# Patient Record
Sex: Female | Born: 1975 | Hispanic: No | Marital: Married | Smoking: Former smoker
Health system: Southern US, Community
[De-identification: ages and names within clinical notes are randomized; demographics above are authoritative.]

## PROBLEM LIST (undated history)

## (undated) ENCOUNTER — Inpatient Hospital Stay (HOSPITAL_COMMUNITY): Payer: Self-pay

## (undated) DIAGNOSIS — Z789 Other specified health status: Secondary | ICD-10-CM

## (undated) DIAGNOSIS — D649 Anemia, unspecified: Secondary | ICD-10-CM

## (undated) DIAGNOSIS — H9190 Unspecified hearing loss, unspecified ear: Secondary | ICD-10-CM

## (undated) DIAGNOSIS — R413 Other amnesia: Secondary | ICD-10-CM

## (undated) DIAGNOSIS — F32A Depression, unspecified: Secondary | ICD-10-CM

## (undated) DIAGNOSIS — F329 Major depressive disorder, single episode, unspecified: Secondary | ICD-10-CM

## (undated) HISTORY — DX: Unspecified hearing loss, unspecified ear: H91.90

## (undated) HISTORY — DX: Major depressive disorder, single episode, unspecified: F32.9

## (undated) HISTORY — DX: Anemia, unspecified: D64.9

## (undated) HISTORY — DX: Depression, unspecified: F32.A

## (undated) HISTORY — PX: EYE SURGERY: SHX253

---
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
## 1998-03-16 ENCOUNTER — Inpatient Hospital Stay (HOSPITAL_COMMUNITY): Admission: AD | Admit: 1998-03-16 | Discharge: 1998-03-16 | Payer: Self-pay | Admitting: *Deleted

## 1998-03-23 ENCOUNTER — Inpatient Hospital Stay (HOSPITAL_COMMUNITY): Admission: AD | Admit: 1998-03-23 | Discharge: 1998-03-23 | Payer: Self-pay | Admitting: Obstetrics & Gynecology

## 1998-04-04 ENCOUNTER — Inpatient Hospital Stay (HOSPITAL_COMMUNITY): Admission: AD | Admit: 1998-04-04 | Discharge: 1998-04-08 | Payer: Self-pay | Admitting: Obstetrics & Gynecology

## 2001-09-20 ENCOUNTER — Other Ambulatory Visit: Admission: RE | Admit: 2001-09-20 | Discharge: 2001-09-20 | Payer: Self-pay | Admitting: Obstetrics and Gynecology

## 2001-09-22 ENCOUNTER — Encounter: Admission: RE | Admit: 2001-09-22 | Discharge: 2001-12-21 | Payer: Self-pay | Admitting: Obstetrics and Gynecology

## 2001-09-29 ENCOUNTER — Emergency Department (HOSPITAL_COMMUNITY): Admission: EM | Admit: 2001-09-29 | Discharge: 2001-09-29 | Payer: Self-pay | Admitting: Emergency Medicine

## 2002-02-12 ENCOUNTER — Inpatient Hospital Stay (HOSPITAL_COMMUNITY): Admission: AD | Admit: 2002-02-12 | Discharge: 2002-02-12 | Payer: Self-pay | Admitting: *Deleted

## 2002-02-18 ENCOUNTER — Encounter: Payer: Self-pay | Admitting: Emergency Medicine

## 2002-02-18 ENCOUNTER — Emergency Department (HOSPITAL_COMMUNITY): Admission: AC | Admit: 2002-02-18 | Discharge: 2002-02-18 | Payer: Self-pay

## 2002-02-18 ENCOUNTER — Observation Stay (HOSPITAL_COMMUNITY): Admission: AD | Admit: 2002-02-18 | Discharge: 2002-02-19 | Payer: Self-pay | Admitting: *Deleted

## 2002-02-21 ENCOUNTER — Ambulatory Visit (HOSPITAL_COMMUNITY): Admission: RE | Admit: 2002-02-21 | Discharge: 2002-02-21 | Payer: Self-pay | Admitting: *Deleted

## 2002-03-24 ENCOUNTER — Inpatient Hospital Stay: Admission: AD | Admit: 2002-03-24 | Discharge: 2002-03-24 | Payer: Self-pay | Admitting: *Deleted

## 2002-03-28 ENCOUNTER — Encounter (HOSPITAL_COMMUNITY): Admission: RE | Admit: 2002-03-28 | Discharge: 2002-04-19 | Payer: Self-pay | Admitting: *Deleted

## 2002-04-22 ENCOUNTER — Inpatient Hospital Stay (HOSPITAL_COMMUNITY): Admission: RE | Admit: 2002-04-22 | Discharge: 2002-04-25 | Payer: Self-pay | Admitting: *Deleted

## 2002-05-31 ENCOUNTER — Inpatient Hospital Stay (HOSPITAL_COMMUNITY): Admission: AD | Admit: 2002-05-31 | Discharge: 2002-05-31 | Payer: Self-pay | Admitting: *Deleted

## 2008-01-14 ENCOUNTER — Encounter (INDEPENDENT_AMBULATORY_CARE_PROVIDER_SITE_OTHER): Payer: Self-pay | Admitting: Nurse Practitioner

## 2008-01-14 ENCOUNTER — Ambulatory Visit: Payer: Self-pay | Admitting: Family Medicine

## 2008-01-14 LAB — CONVERTED CEMR LAB
ALT: 12 units/L (ref 0–35)
AST: 16 units/L (ref 0–37)
Albumin: 4.2 g/dL (ref 3.5–5.2)
Alkaline Phosphatase: 64 units/L (ref 39–117)
BUN: 17 mg/dL (ref 6–23)
Basophils Absolute: 0 10*3/uL (ref 0.0–0.1)
Basophils Relative: 0 % (ref 0–1)
CO2: 21 meq/L (ref 19–32)
Calcium: 9.4 mg/dL (ref 8.4–10.5)
Chloride: 106 meq/L (ref 96–112)
Creatinine, Ser: 0.6 mg/dL (ref 0.40–1.20)
Eosinophils Absolute: 0.2 10*3/uL (ref 0.0–0.7)
Eosinophils Relative: 3 % (ref 0–5)
Glucose, Bld: 86 mg/dL (ref 70–99)
HCT: 41.4 % (ref 36.0–46.0)
Hemoglobin: 13.5 g/dL (ref 12.0–15.0)
Lymphocytes Relative: 39 % (ref 12–46)
Lymphs Abs: 2.5 10*3/uL (ref 0.7–4.0)
MCHC: 32.6 g/dL (ref 30.0–36.0)
MCV: 86.4 fL (ref 78.0–100.0)
Monocytes Absolute: 0.4 10*3/uL (ref 0.1–1.0)
Monocytes Relative: 6 % (ref 3–12)
Neutro Abs: 3.4 10*3/uL (ref 1.7–7.7)
Neutrophils Relative %: 53 % (ref 43–77)
Platelets: 338 10*3/uL (ref 150–400)
Potassium: 4.4 meq/L (ref 3.5–5.3)
RBC: 4.79 M/uL (ref 3.87–5.11)
RDW: 12.5 % (ref 11.5–15.5)
Sodium: 140 meq/L (ref 135–145)
TSH: 1.873 microintl units/mL (ref 0.350–5.50)
Total Bilirubin: 0.5 mg/dL (ref 0.3–1.2)
Total Protein: 7 g/dL (ref 6.0–8.3)
WBC: 6.5 10*3/uL (ref 4.0–10.5)

## 2008-02-11 ENCOUNTER — Ambulatory Visit: Payer: Self-pay | Admitting: Family Medicine

## 2008-05-26 ENCOUNTER — Encounter (INDEPENDENT_AMBULATORY_CARE_PROVIDER_SITE_OTHER): Payer: Self-pay | Admitting: Nurse Practitioner

## 2008-05-26 LAB — CONVERTED CEMR LAB
Basophils Absolute: 0 10*3/uL (ref 0.0–0.1)
Basophils Relative: 0 % (ref 0–1)
Eosinophils Absolute: 0.1 10*3/uL (ref 0.0–0.7)
Eosinophils Relative: 2 % (ref 0–5)
HCT: 33.7 % — ABNORMAL LOW (ref 36.0–46.0)
Hemoglobin: 10.7 g/dL — ABNORMAL LOW (ref 12.0–15.0)
Lymphocytes Relative: 42 % (ref 12–46)
Lymphs Abs: 2.5 10*3/uL (ref 0.7–4.0)
MCHC: 31.8 g/dL (ref 30.0–36.0)
MCV: 82.6 fL (ref 78.0–100.0)
Monocytes Absolute: 0.4 10*3/uL (ref 0.1–1.0)
Monocytes Relative: 6 % (ref 3–12)
Neutro Abs: 3 10*3/uL (ref 1.7–7.7)
Neutrophils Relative %: 50 % (ref 43–77)
Platelets: 350 10*3/uL (ref 150–400)
RBC: 4.08 M/uL (ref 3.87–5.11)
RDW: 12.3 % (ref 11.5–15.5)
TSH: 2.967 microintl units/mL (ref 0.350–5.50)
WBC: 6 10*3/uL (ref 4.0–10.5)

## 2008-05-29 ENCOUNTER — Ambulatory Visit: Payer: Self-pay | Admitting: Internal Medicine

## 2008-06-14 ENCOUNTER — Ambulatory Visit: Payer: Self-pay | Admitting: Internal Medicine

## 2008-06-14 ENCOUNTER — Encounter (INDEPENDENT_AMBULATORY_CARE_PROVIDER_SITE_OTHER): Payer: Self-pay | Admitting: Family Medicine

## 2008-06-14 LAB — CONVERTED CEMR LAB
Ferritin: 2 ng/mL — ABNORMAL LOW (ref 10–291)
Folate: >20 ng/mL
Iron: 23 ug/dL — ABNORMAL LOW (ref 42–145)
Saturation Ratios: 5 % — ABNORMAL LOW (ref 20–55)
TIBC: 442 ug/dL (ref 250–470)
UIBC: 419 ug/dL
Vitamin B-12: 496 pg/mL (ref 211–911)

## 2008-06-15 ENCOUNTER — Ambulatory Visit: Payer: Self-pay | Admitting: Internal Medicine

## 2008-06-22 ENCOUNTER — Ambulatory Visit: Payer: Self-pay | Admitting: *Deleted

## 2008-06-27 ENCOUNTER — Encounter (INDEPENDENT_AMBULATORY_CARE_PROVIDER_SITE_OTHER): Payer: Self-pay | Admitting: Family Medicine

## 2008-06-27 ENCOUNTER — Ambulatory Visit: Payer: Self-pay | Admitting: Internal Medicine

## 2008-06-27 LAB — CONVERTED CEMR LAB
Chlamydia, DNA Probe: NEGATIVE
GC Probe Amp, Genital: NEGATIVE

## 2008-08-15 ENCOUNTER — Ambulatory Visit: Payer: Self-pay | Admitting: Internal Medicine

## 2008-10-19 ENCOUNTER — Emergency Department (HOSPITAL_COMMUNITY): Admission: EM | Admit: 2008-10-19 | Discharge: 2008-10-19 | Payer: Self-pay | Admitting: Emergency Medicine

## 2008-10-27 ENCOUNTER — Ambulatory Visit: Payer: Self-pay | Admitting: Internal Medicine

## 2008-11-06 ENCOUNTER — Emergency Department (HOSPITAL_COMMUNITY): Admission: EM | Admit: 2008-11-06 | Discharge: 2008-11-06 | Payer: Self-pay | Admitting: Family Medicine

## 2008-11-06 ENCOUNTER — Emergency Department (HOSPITAL_COMMUNITY): Admission: EM | Admit: 2008-11-06 | Discharge: 2008-11-06 | Payer: Self-pay | Admitting: Emergency Medicine

## 2009-01-25 ENCOUNTER — Ambulatory Visit: Payer: Self-pay | Admitting: Family Medicine

## 2009-01-25 LAB — CONVERTED CEMR LAB
Basophils Absolute: 0 10*3/uL (ref 0.0–0.1)
Basophils Relative: 0 % (ref 0–1)
Eosinophils Absolute: 0.3 10*3/uL (ref 0.0–0.7)
Eosinophils Relative: 4 % (ref 0–5)
HCT: 40.2 % (ref 36.0–46.0)
Hemoglobin: 13.2 g/dL (ref 12.0–15.0)
Lymphocytes Relative: 34 % (ref 12–46)
Lymphs Abs: 2.4 10*3/uL (ref 0.7–4.0)
MCHC: 32.8 g/dL (ref 30.0–36.0)
MCV: 84.8 fL (ref 78.0–100.0)
Monocytes Absolute: 0.4 10*3/uL (ref 0.1–1.0)
Monocytes Relative: 6 % (ref 3–12)
Neutro Abs: 4 10*3/uL (ref 1.7–7.7)
Neutrophils Relative %: 56 % (ref 43–77)
Platelets: 293 10*3/uL (ref 150–400)
RBC: 4.74 M/uL (ref 3.87–5.11)
RDW: 13 % (ref 11.5–15.5)
Vit D, 1,25-Dihydroxy: 29 — ABNORMAL LOW (ref 30–89)
WBC: 7.2 10*3/uL (ref 4.0–10.5)

## 2009-04-18 ENCOUNTER — Ambulatory Visit: Payer: Self-pay | Admitting: Vascular Surgery

## 2009-05-03 ENCOUNTER — Ambulatory Visit: Payer: Self-pay | Admitting: Vascular Surgery

## 2009-09-18 ENCOUNTER — Ambulatory Visit (HOSPITAL_BASED_OUTPATIENT_CLINIC_OR_DEPARTMENT_OTHER): Admission: RE | Admit: 2009-09-18 | Discharge: 2009-09-18 | Payer: Self-pay | Admitting: Internal Medicine

## 2009-09-30 ENCOUNTER — Ambulatory Visit: Payer: Self-pay | Admitting: Internal Medicine

## 2009-10-25 ENCOUNTER — Ambulatory Visit: Payer: Self-pay | Admitting: Psychology

## 2009-12-18 ENCOUNTER — Ambulatory Visit: Payer: Self-pay | Admitting: Psychology

## 2010-11-30 ENCOUNTER — Inpatient Hospital Stay (HOSPITAL_COMMUNITY)
Admission: AD | Admit: 2010-11-30 | Discharge: 2010-11-30 | Payer: Self-pay | Source: Home / Self Care | Attending: Obstetrics and Gynecology | Admitting: Obstetrics and Gynecology

## 2010-12-26 ENCOUNTER — Encounter
Admission: RE | Admit: 2010-12-26 | Discharge: 2011-01-21 | Payer: Self-pay | Source: Home / Self Care | Attending: Obstetrics and Gynecology | Admitting: Obstetrics and Gynecology

## 2010-12-26 ENCOUNTER — Ambulatory Visit (HOSPITAL_COMMUNITY)
Admission: RE | Admit: 2010-12-26 | Discharge: 2010-12-26 | Payer: Self-pay | Source: Home / Self Care | Attending: Obstetrics and Gynecology | Admitting: Obstetrics and Gynecology

## 2011-01-27 ENCOUNTER — Encounter (HOSPITAL_COMMUNITY)
Admission: RE | Admit: 2011-01-27 | Discharge: 2011-01-27 | Disposition: A | Discharge status: Home / Self Care | Payer: Medicaid Other | Source: Ambulatory Visit | Attending: Obstetrics and Gynecology | Admitting: Obstetrics and Gynecology

## 2011-01-27 DIAGNOSIS — Z01812 Encounter for preprocedural laboratory examination: Secondary | ICD-10-CM | POA: Insufficient documentation

## 2011-01-27 LAB — CBC
MCH: 27.6 pg (ref 26.0–34.0)
MCV: 85.3 fL (ref 78.0–100.0)
Platelets: 271 10*3/uL (ref 150–400)
RDW: 13 % (ref 11.5–15.5)

## 2011-02-03 ENCOUNTER — Inpatient Hospital Stay (HOSPITAL_COMMUNITY)
Admission: RE | Admit: 2011-02-03 | Discharge: 2011-02-06 | DRG: 766 | Disposition: A | Discharge status: Home / Self Care | Payer: Medicaid Other | Source: Ambulatory Visit | Attending: Obstetrics and Gynecology | Admitting: Obstetrics and Gynecology

## 2011-02-03 DIAGNOSIS — O34219 Maternal care for unspecified type scar from previous cesarean delivery: Principal | ICD-10-CM | POA: Diagnosis present

## 2011-02-03 LAB — BASIC METABOLIC PANEL
BUN: 8 mg/dL (ref 6–23)
CO2: 21 mEq/L (ref 19–32)
Chloride: 105 mEq/L (ref 96–112)
Creatinine, Ser: 0.48 mg/dL (ref 0.4–1.2)
Glucose, Bld: 93 mg/dL (ref 70–99)

## 2011-02-03 LAB — ABO/RH: ABO/RH(D): O POS

## 2011-02-04 LAB — BASIC METABOLIC PANEL
Chloride: 105 mEq/L (ref 96–112)
GFR calc non Af Amer: >60 mL/min (ref >60)
Glucose, Bld: 113 mg/dL — ABNORMAL HIGH (ref 70–99)
Potassium: 3.9 mEq/L (ref 3.5–5.1)
Sodium: 135 mEq/L (ref 135–145)

## 2011-02-04 LAB — CBC
HCT: 31.6 % — ABNORMAL LOW (ref 36.0–46.0)
Hemoglobin: 10.2 g/dL — ABNORMAL LOW (ref 12.0–15.0)
RBC: 3.68 MIL/uL — ABNORMAL LOW (ref 3.87–5.11)
RDW: 13.1 % (ref 11.5–15.5)
WBC: 8.2 10*3/uL (ref 4.0–10.5)

## 2011-02-05 LAB — GLUCOSE, CAPILLARY
Glucose-Capillary: 91 mg/dL (ref 70–99)
Glucose-Capillary: 87 mg/dL (ref 70–99)
Glucose-Capillary: 144 mg/dL — ABNORMAL HIGH (ref 70–99)

## 2011-02-06 NOTE — Op Note (Signed)
NAME:  Angela Romero, Angela Romero           ACCOUNT NO.:  [id number]  MEDICAL RECORD NO.:  [id number]           PATIENT TYPE:  I  LOCATION:  9129                          FACILITY:  [facility]  PHYSICIAN:  Sherron Monday, MD        DATE OF BIRTH:  August 04, 1976  DATE OF PROCEDURE:  02/03/2011 DATE OF DISCHARGE:                              OPERATIVE REPORT   PREOPERATIVE DIAGNOSES:  Intrauterine pregnancy at 39 weeks, declined vaginal birth after cesarean (section), history of low transverse cesarean section, desires repeat.  POSTOPERATIVE DIAGNOSES:  Intrauterine pregnancy at 39 weeks, declined vaginal birth after cesarean (section), history of low transverse cesarean section, desires repeat, delivered.  PROCEDURE:  Repeat low transverse cesarean section.  SURGEON:  Sherron Monday, MD  ASSISTANT:  Malachi Pro. Ambrose Mantle, MD  ANESTHESIA:  Spinal.  FINDINGS:  Viable female infant at 7:40 a.m. with Apgars of 8 at 1 and 9 at 5 minutes and a weight of 8 pounds 1 ounce, normal uterus, tubes, and ovaries.  SPECIMEN:  Placenta.  COMPLICATIONS:  None.  ESTIMATED BLOOD LOSS:  900 mL  IV FLUIDS:  1600 mL urine output, 75 mL clear urine at the end of the procedure.  PROCEDURE:  After informed consent was reviewed with the patient including risks, benefits, and alternatives of the surgical procedure, including but not limited to bleeding, infection, and damage to the surrounding organs, as well as trouble healing, she was transported to the OR where spinal anesthesia was placed and found to be adequate.  She was then returned to the table in supine position with a leftward tilt and prepped and draped in the normal sterile fashion.  A catheter was sterilely placed.  A Pfannenstiel incision was made at the level of her previous incision, carried down to the underlying layer of fascia sharply.  The fascia was then incised in the midline, and the incision was extended laterally with Mayo scissors.  The inferior  aspect of the fascial incision was grasped with Kocher clamps, elevated, and the rectus muscles were dissected off both bluntly and sharply.  Attention was then turned to the superior portion of the incision which in a similar fashion, grasped with Kocher clamps, elevating, and the rectus muscles were dissected off both bluntly and sharply.  Midline was easily identified.  Peritoneum was entered bluntly.  The incision was extended superiorly and inferiorly with good visualization of the bladder.  The Alexis skin retractor was placed carefully making sure no bowel was entrapped.  The uterus was explored, the vesicoperitoneum was easily identified, tented up with smooth pickups and bladder flap was created both digitally and sharply.  The uterus was incised in a transverse fashion.  Infant was delivered with aid of a vacuum from the vertex presentation.  Nose and mouth were suctioned.  Cord was clamped and cut. Infant was handed off to awaiting pediatric staff.  The placenta was expressed and manually extracted.  The uterus was cleared of all clots and debris.  The uterine incision was closed in layers of 0 Monocryl, the first of which was a running lock and the second as an imbricating. The incision was  noted to be hemostatic.  Copious pelvic irrigation was performed.  The Alexis skin retractor was removed.  The peritoneum was outlined with Kelly's and reapproximated with 2-0 Vicryl.  Subfascial planes in rectus muscles were made hemostatic. The fascia was reapproximated with 0 Vicryl.  The subcuticular adipose layer was made hemostatic with Bovie cautery, irrigated, and the skin with released.  The incision had been slightly indented, dead space was reapproximated with 2-0 plain gut.  Skin was closed with staples.  The patient tolerated the procedure well.  Sponge, lap, and needle counts were correct x2 at the end of the procedure.     Sherron Monday, MD     JB/MEDQ  D:  02/03/2011   T:  02/03/2011  Job:  [id number]  Electronically Signed by Sherron Monday MD on 02/06/2011 05:22:10 PM

## 2011-02-06 NOTE — H&P (Signed)
NAMECYANI, Angela Romero           ACCOUNT NO.:  [id number]  MEDICAL RECORD NO.:  [id number]           PATIENT TYPE:  O  LOCATION:  SDC                           [facility]:  [facility]  PHYSICIAN:  Sherron Monday, MD        DATE OF BIRTH:  22-Jan-1976  DATE OF ADMISSION:  02/03/2010 DATE OF DISCHARGE:  01/27/2011                             HISTORY & PHYSICAL   ADMISSION DIAGNOSES:  Intrauterine pregnancy at term with history of low- transverse cesarean section as well as declined vaginal birth after cesarean section.  PROCEDURE:  Planned repeat low-transverse cesarean section.  HISTORY OF PRESENT ILLNESS:  A 35 year old G3, P2-0-0-2 at 60 plus weeks for repeat low-transverse cesarean section.  She declines VBEC.  She has had good fetal movement.  No loss of fluid.  No vaginal bleeding. Occasional contractions.  Discussed with her at length risks, benefits, and alternatives of repeat section versus VBEC.  She voiced understanding and wanted to proceed.  Her prenatal care was complicated by late prenatal care as well as gestational diabetes treated with 2.5 mg of glyburide.  PAST MEDICAL HISTORY:  Significant for depression and anxiety.  PAST SURGICAL HISTORY:  Significant for C-section and ear surgery.  PAST OB/GYN HISTORY:  She is a G3, P2-0-0-2.  Her first delivery on April 05, 1998, with female infant by C-section for which she has had prolonged labor.  Second pregnancy was in 04/2002 with female infant delivered as a VBEC.  G3 is the current pregnancy.  No history of any abnormal Pap smears or sexually transmitted diseases.  MEDICATIONS:  Include glyburide 2.5 as well as prenatal vitamins.  ALLERGIES:  No known drug allergies.  No latex allergies.  SOCIAL HISTORY:  The patient denies alcohol, tobacco, or drug use.  She is married and works in [employer].  FAMILY HISTORY:  Significant for diabetes, MI, and prostate cancer.  PRENATAL LABORATORIES:  She is O+.  Antibody screen  negative. Hemoglobin 11.9.  Platelets within normal limits.  Rubella immune.  RPR nonreactive.  Hepatitis B surface antigen negative.  HIV negative. Platelets 321,000.  Gonorrhea negative.  Chlamydia negative.  Cystic fibrosis screen negative.  She had positive varicella antibodies. Ultrasound at 19 weeks confirmed an EDC of 02/20.  AFP was within normal limits.  Glucola 235 after receiving 3-hour Glucola.  Group B strep was negative.  PHYSICAL EXAMINATION:  GENERAL:  No apparent distress. VITAL SIGNS:  Afebrile.  Vital signs stable. CHEST:  Clear to auscultation. HEART:  Regular rate and rhythm. LUNGS: Clear to auscultation bilaterally. ABDOMEN:  Soft, nondistended, and nontender. EXTREMITIES: Symmetric and nontender. VAGINAL:  Reveals cervix was 2 cm dilated, 30% effaced, and minus 2 station.  Fetal heart tones were in the 150s at last check in the office.  ASSESSMENT AND PLAN:  A 35 year old G3, P 2-0-0-2 at 39 weeks for repeat low-transverse cesarean section.  After discussion of risks, benefits, and alternatives, she wished to proceed.  This will be performed on the 13th.     Sherron Monday, MD     JB/MEDQ  D:  01/31/2011  T:  01/31/2011  Job:  [id number]  Electronically Signed by Sherron Monday MD on 02/06/2011 05:21:55 PM

## 2011-02-10 ENCOUNTER — Inpatient Hospital Stay (HOSPITAL_COMMUNITY): Admission: AD | Admit: 2011-02-10 | Payer: Self-pay | Admitting: Obstetrics & Gynecology

## 2011-02-11 NOTE — Discharge Summary (Signed)
  [name], Angela Romero           ACCOUNT NO.:  [id number]  MEDICAL RECORD NO.:  [id number]           PATIENT TYPE:  I  LOCATION:  9129                          [facility]  PHYSICIAN:  Sherron Monday, MD        DATE OF BIRTH:  [birth year]  DATE OF ADMISSION:  02/03/2011 DATE OF DISCHARGE:  02/06/2011                              DISCHARGE SUMMARY   ADMITTING DIAGNOSES: 1. Intrauterine pregnancy at term. 2. History of low transverse cesarean section. 3. Declines vaginal birth after cesarean section.  DISCHARGE DIAGNOSIS: 1. Intrauterine pregnancy at term, delivered. 2. History of low transverse cesarean section. 3. Declines vaginal birth after cesarean section.  PROCEDURE:  Repeat low transverse cesarean section.  HISTORY OF PRESENT ILLNESS:  A 35 year old G3, P2-0-0-2 at 15 plus weeks for repeat low transverse cesarean section.  Please see the dictated history for complete history and physical.  She was admitted, underwent a cesarean section without complication on February 03, 2011, delivering a viable female infant at 7:40 a.m., Apgars of 8 at 1 minute and 9 at 5 minutes, and a weight of 8 pounds and 1 ounce.  Normal uterus, tubes and, ovaries are noted.  EBL was approximately 900 mL.  Her postpartum course was relatively uncomplicated.  She remained afebrile.  Vital signs were stable throughout.  Hemoglobin decreased from 12.6 to 10.2. This was well tolerated.  Her postpartum sugars were all less than 200. Her glyburide had been discontinued.  We will follow up with a 2-hour GTT after a postpartum check.  On postoperative day #3, she was ambulating, voiding.  Her pain was well controlled and she had normal lochia.  At this time, her staples were removed and Steri-Strips were applied to incision.  She was given routine discharge instructions and numbers to call with any questions or problems as well as prescriptions for Motrin, Percocet, and prenatal vitamins.  She will  follow up in the office in approximately 2 weeks.  She voices understanding to all of this.  She is O positive, rubella immune.  Hemoglobin 12.6-10.2.  She is both breast and bottle.  We will discuss contraception at her followup.     Sherron Monday, MD     JB/MEDQ  D:  02/06/2011  T:  02/06/2011  Job:  [id number]  Electronically Signed by Sherron Monday MD on 02/11/2011 01:39:30 PM

## 2011-02-12 ENCOUNTER — Ambulatory Visit (HOSPITAL_COMMUNITY)
Admission: RE | Admit: 2011-02-12 | Discharge: 2011-02-12 | Disposition: A | Discharge status: Home / Self Care | Payer: Medicaid Other | Source: Ambulatory Visit | Attending: Obstetrics and Gynecology | Admitting: Obstetrics and Gynecology

## 2011-02-12 DIAGNOSIS — O9279 Other disorders of lactation: Secondary | ICD-10-CM | POA: Insufficient documentation

## 2011-03-03 LAB — URINALYSIS, ROUTINE W REFLEX MICROSCOPIC
Bilirubin Urine: NEGATIVE
Hgb urine dipstick: NEGATIVE
Specific Gravity, Urine: 1.02 (ref 1.005–1.030)
Urobilinogen, UA: 0.2 mg/dL (ref 0.0–1.0)
pH: 7.5 (ref 5.0–8.0)

## 2011-05-06 NOTE — Consult Note (Signed)
NEW PATIENT CONSULTATION   Zolman, Seona  DOB:  08/25/1976                                       04/18/2009  JXBJY#:[id number]   The patient presents today for evaluation of area of discomfort in her  right medial distal thigh.  She reports that she is having pain with  prolonged standing.  She works in [employer] for 8 hours shifts and  reports significant pain over this area.  She does not have a history of  deep venous thrombosis and does not have any history of swelling or  bleeding or venous ulcers.  She has worn compression garments and these  do give her some relief with prolonged standing.   PAST MEDICAL HISTORY:  Significant for hearing loss.   SOCIAL HISTORY:  She does smoke 1 pack cigarettes per day.  Does not  drink alcohol.   PHYSICAL EXAMINATION:  A well-developed, well-nourished female appearing  her stated age of 42.  Blood pressure is 112/71, pulse 97, respirations  18.  Her dorsalis pedis pulses are 2+ bilaterally.  She does not have  any evidence of changes of venous hypertension or varicose veins.  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   She underwent a formal duplex in our office and this shows no evidence  of reflux in her great or small saphenous vein bilaterally.  On duplex  imaging of this specific area she does have reticular veins under the  skin.  They do not appear to be arising from the saphenous vein.   I discussed this at length with the patient.  I have explained that this  does not lead her to any more serious complications of venous  hypertension.  I did explain the option of sclerotherapy for potential  improvement in her discomfort.  She will consider this and notify us  should she wish to proceed with sclerotherapy.  I feel that this would  be appropriate since she has failed conservative treatment with  compression reporting that this makes the  sensation somewhat better but  she continues to have pain with prolonged standing interfering with her  work.  She will notify us should she wish to proceed.   Larina Earthly, M.D.  Electronically Signed   TFE/MEDQ  D:  04/18/2009  T:  04/19/2009  Job:  [id number]   cc:   Fleet Contras, M.D.

## 2011-05-06 NOTE — Procedures (Signed)
LOWER EXTREMITY VENOUS REFLUX EXAM   INDICATION:  Bilateral spider vein pains.   EXAM:  Using color-flow imaging and pulse Doppler spectral analysis, the  right and left common femoral, superficial femoral, popliteal, posterior  tibial, greater and lesser saphenous veins are evaluated.  There is no  evidence suggesting deep venous insufficiency in the right and left  lower extremity.   The right and left saphenofemoral junctions are competent.  The right  and left GSV's are competent with the caliber as described below.   The right and left proximal short saphenous veins demonstrate  competency.   GSV Diameter (used if found to be incompetent only)                                            Right    Left  Proximal Greater Saphenous Vein           cm       cm  Proximal-to-mid-thigh                     cm       cm  Mid thigh                                 cm       cm  Mid-distal thigh                          cm       cm  Distal thigh                              cm       cm  Knee                                      cm       cm   IMPRESSION:  1. Right and left greater saphenous veins demonstrate competency.  2. The right and left greater saphenous veins are not aneurysmal.  3. The right and left greater saphenous veins are not tortuous.  4. The deep venous system is competent.  5. The right and left lesser saphenous veins are competent.   ___________________________________________  Larina Earthly, M.D.   AC/MEDQ  D:  04/18/2009  T:  04/18/2009  Job:  [id number]

## 2011-05-07 ENCOUNTER — Emergency Department (HOSPITAL_COMMUNITY)
Admission: EM | Admit: 2011-05-07 | Discharge: 2011-05-07 | Disposition: A | Discharge status: Home / Self Care | Payer: Medicaid Other | Attending: Emergency Medicine | Admitting: Emergency Medicine

## 2011-05-07 ENCOUNTER — Emergency Department (HOSPITAL_COMMUNITY): Payer: Medicaid Other

## 2011-05-07 DIAGNOSIS — Y92410 Unspecified street and highway as the place of occurrence of the external cause: Secondary | ICD-10-CM | POA: Insufficient documentation

## 2011-05-07 DIAGNOSIS — M545 Low back pain, unspecified: Secondary | ICD-10-CM | POA: Insufficient documentation

## 2011-05-07 DIAGNOSIS — M542 Cervicalgia: Secondary | ICD-10-CM | POA: Insufficient documentation

## 2011-05-07 DIAGNOSIS — T1490XA Injury, unspecified, initial encounter: Secondary | ICD-10-CM | POA: Insufficient documentation
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
## 2012-07-02 ENCOUNTER — Emergency Department (HOSPITAL_COMMUNITY): Admission: EM | Admit: 2012-07-02 | Discharge: 2012-07-02 | Payer: Medicaid Other | Source: Home / Self Care

## 2012-09-29 ENCOUNTER — Other Ambulatory Visit: Payer: Self-pay | Admitting: Diagnostic Neuroimaging

## 2012-09-29 DIAGNOSIS — R413 Other amnesia: Secondary | ICD-10-CM

## 2012-09-29 DIAGNOSIS — H9190 Unspecified hearing loss, unspecified ear: Secondary | ICD-10-CM

## 2012-09-29 DIAGNOSIS — F329 Major depressive disorder, single episode, unspecified: Secondary | ICD-10-CM

## 2012-10-05 ENCOUNTER — Ambulatory Visit
Admission: RE | Admit: 2012-10-05 | Discharge: 2012-10-05 | Disposition: A | Discharge status: Home / Self Care | Payer: Medicaid Other | Source: Ambulatory Visit | Attending: Diagnostic Neuroimaging | Admitting: Diagnostic Neuroimaging

## 2012-10-05 DIAGNOSIS — F329 Major depressive disorder, single episode, unspecified: Secondary | ICD-10-CM

## 2012-10-05 DIAGNOSIS — R413 Other amnesia: Secondary | ICD-10-CM

## 2012-10-05 DIAGNOSIS — H9190 Unspecified hearing loss, unspecified ear: Secondary | ICD-10-CM

## 2013-04-15 ENCOUNTER — Encounter (HOSPITAL_COMMUNITY): Payer: Self-pay | Admitting: *Deleted

## 2013-04-15 ENCOUNTER — Emergency Department (INDEPENDENT_AMBULATORY_CARE_PROVIDER_SITE_OTHER): Payer: Medicaid Other

## 2013-04-15 ENCOUNTER — Emergency Department (HOSPITAL_COMMUNITY)
Admission: EM | Admit: 2013-04-15 | Discharge: 2013-04-15 | Disposition: A | Discharge status: Home / Self Care | Payer: Medicaid Other | Source: Home / Self Care

## 2013-04-15 DIAGNOSIS — J020 Streptococcal pharyngitis: Secondary | ICD-10-CM

## 2013-04-15 DIAGNOSIS — J45909 Unspecified asthma, uncomplicated: Secondary | ICD-10-CM

## 2013-04-15 DIAGNOSIS — J309 Allergic rhinitis, unspecified: Secondary | ICD-10-CM

## 2013-04-15 LAB — POCT URINALYSIS DIP (DEVICE)
Bilirubin Urine: NEGATIVE
Ketones, ur: NEGATIVE mg/dL
pH: 5.5 (ref 5.0–8.0)

## 2013-04-15 MED ORDER — BROMPHENIRAMINE-PHENYLEPHRINE 1-2.5 MG/5ML PO ELIX: 5.0000 mL | ORAL_SOLUTION | ORAL | Status: DC | PRN

## 2013-04-15 MED ORDER — AMOXICILLIN 500 MG PO CAPS: 500.0000 mg | ORAL_CAPSULE | Freq: Three times a day (TID) | ORAL | Status: DC

## 2013-04-15 MED ORDER — ALBUTEROL SULFATE HFA 108 (90 BASE) MCG/ACT IN AERS: 2.0000 | INHALATION_SPRAY | RESPIRATORY_TRACT | Status: DC | PRN

## 2013-04-15 MED ORDER — METHYLPREDNISOLONE 4 MG PO KIT: PACK | ORAL | Status: DC

## 2013-04-15 NOTE — ED Provider Notes (Signed)
Medical screening examination/treatment/procedure(s) were performed by resident physician or non-physician practitioner and as supervising physician I was immediately available for consultation/collaboration.   Barkley Bruns MD.   Linna Hoff, MD 04/15/13 2027

## 2013-04-15 NOTE — ED Notes (Signed)
Pt   Has  Multiple   Symptoms        To  Include   Earache       Coughing  sorethroat  As  Well  As     Body  Aches       Pt reports  The  Sym[ptoms  Have  Persisted  For  About  One  Month        She  Is  Sitting upright on  Exam table  Speaking in  Complete  sentances  And  Is  In    No  Distress

## 2013-04-15 NOTE — ED Provider Notes (Signed)
History     CSN: [id number]  Arrival date & time 04/15/13  1446   None     Chief Complaint  Patient presents with  . Otalgia    (Consider location/radiation/quality/duration/timing/severity/associated sxs/prior treatment) HPI Comments: 37 year old female presents with fatigue, bodyaches, cough, decreased energy, ear discomfort and popping, sore throat and upper respiratory congestion. She denies fever, vomiting, diarrhea or abdominal pain. She took 2 doses of liquid amoxicillin yesterday and felt better she is requesting another bottle.   History reviewed. No pertinent past medical history.  History reviewed. No pertinent past surgical history.  No family history on file.  History  Substance Use Topics  . Smoking status: Never Smoker   . Smokeless tobacco: Not on file  . Alcohol Use: No    OB History   Grav Para Term Preterm Abortions TAB SAB Ect Mult Living                  Review of Systems  Constitutional: Positive for chills, activity change and fatigue. Negative for fever and appetite change.  HENT: Positive for ear pain, congestion, sore throat, rhinorrhea and postnasal drip. Negative for facial swelling, neck pain and neck stiffness.   Eyes: Negative.   Respiratory: Negative.   Cardiovascular: Negative.   Gastrointestinal: Negative.   Genitourinary: Positive for dysuria and frequency.  Musculoskeletal: Positive for myalgias.  Skin: Negative.  Negative for pallor and rash.  Neurological: Negative.     Allergies  Review of patient's allergies indicates no known allergies.  Home Medications   Current Outpatient Rx  Name  Route  Sig  Dispense  Refill  . albuterol (PROVENTIL HFA;VENTOLIN HFA) 108 (90 BASE) MCG/ACT inhaler   Inhalation   Inhale 2 puffs into the lungs every 4 (four) hours as needed for wheezing.   1 Inhaler   0   . amoxicillin (AMOXIL) 500 MG capsule   Oral   Take 1 capsule (500 mg total) by mouth 3 (three) times daily.   21 capsule   0   . Brompheniramine-Phenylephrine 1-2.5 MG/5ML syrup   Oral   Take 5 mLs by mouth every 4 (four) hours as needed for cough.   118 mL   0   . methylPREDNISolone (MEDROL DOSEPAK) 4 MG tablet      follow package directions   21 tablet   0     BP 113/57  Pulse 77  Temp(Src) 98.6 F (37 C) (Oral)  Resp 20  SpO2 98%  LMP 03/25/2013  Physical Exam  Nursing note and vitals reviewed. Constitutional: She is oriented to person, place, and time. She appears well-developed and well-nourished. No distress.  HENT:  Bilateral TMs without erythema or bulging. Bilateral TMs with scarring and areas of rupture from previous surgeries. No drainage. Oropharynx erythematous with moderate amount of clear PND. No exudate  Eyes: Conjunctivae and EOM are normal.  Neck: Normal range of motion. Neck supple.  Cardiovascular: Normal rate, regular rhythm and normal heart sounds.   Pulmonary/Chest: Effort normal and breath sounds normal. No respiratory distress. She has no wheezes. She has no rales.  Abdominal: Soft. She exhibits no distension. There is no tenderness.  Musculoskeletal: Normal range of motion. She exhibits no edema.  Lymphadenopathy:    She has no cervical adenopathy.  Neurological: She is alert and oriented to person, place, and time.  Skin: Skin is warm and dry. No rash noted.  Psychiatric: She has a normal mood and affect.    ED Course  Procedures (including  critical care time)  Labs Reviewed  POCT URINALYSIS DIP (DEVICE) - Abnormal; Notable for the following:    Hgb urine dipstick SMALL (*)    All other components within normal limits   Dg Chest 2 View  04/15/2013  *RADIOLOGY REPORT*  Clinical Data: Cough.  CHEST - 2 VIEW  Comparison: 11/06/2008  Findings: Airway thickening may reflect bronchitis or reactive airways disease.  No airspace opacity is identified to suggest bacterial pneumonia pattern.  Cardiac and mediastinal contours appear unremarkable.  No pleural effusion  noted.  IMPRESSION:  1. Airway thickening may reflect bronchitis or reactive airways disease.  No airspace opacity is identified to suggest bacterial pneumonia pattern.   Original Report Authenticated By: Gaylyn Rong, M.D.      1. Allergic rhinitis   2. Strep pharyngitis   3. RAD (reactive airway disease)       MDM  Albuterol HFA 2 puffs every 4-6 hours as needed for cough and wheeze Amoxicillin 500 mg 3 times a day for 7 days Dimetapp 5 meals every 4 hours when necessary cough and drainage Medrol Dosepak as directed Drink plenty of fluids stay well hydrated .   Follow with your primary care doctor next week call for appointment   Hayden Rasmussen, NP 04/15/13 1754

## 2013-04-18 LAB — POCT RAPID STREP A: Streptococcus, Group A Screen (Direct): POSITIVE — AB

## 2013-12-28 ENCOUNTER — Other Ambulatory Visit: Payer: Self-pay | Admitting: Otolaryngology

## 2013-12-28 DIAGNOSIS — H908 Mixed conductive and sensorineural hearing loss, unspecified: Secondary | ICD-10-CM

## 2014-01-02 ENCOUNTER — Other Ambulatory Visit: Payer: Medicaid Other

## 2014-01-09 ENCOUNTER — Other Ambulatory Visit: Payer: Medicaid Other

## 2014-01-10 ENCOUNTER — Ambulatory Visit
Admission: RE | Admit: 2014-01-10 | Discharge: 2014-01-10 | Disposition: A | Discharge status: Home / Self Care | Payer: Medicaid Other | Source: Ambulatory Visit | Attending: Otolaryngology | Admitting: Otolaryngology

## 2014-01-10 DIAGNOSIS — H908 Mixed conductive and sensorineural hearing loss, unspecified: Secondary | ICD-10-CM

## 2014-07-22 HISTORY — PX: REFRACTIVE SURGERY: SHX103

## 2014-09-07 ENCOUNTER — Other Ambulatory Visit (HOSPITAL_COMMUNITY)
Admission: RE | Admit: 2014-09-07 | Discharge: 2014-09-07 | Disposition: A | Discharge status: Home / Self Care | Payer: Medicaid Other | Source: Ambulatory Visit | Attending: Family Medicine | Admitting: Family Medicine

## 2014-09-07 ENCOUNTER — Other Ambulatory Visit: Payer: Medicaid Other | Admitting: Family Medicine

## 2014-09-07 DIAGNOSIS — N760 Acute vaginitis: Secondary | ICD-10-CM | POA: Diagnosis present

## 2014-09-07 DIAGNOSIS — Z113 Encounter for screening for infections with a predominantly sexual mode of transmission: Secondary | ICD-10-CM | POA: Insufficient documentation

## 2014-09-08 ENCOUNTER — Ambulatory Visit
Admission: RE | Admit: 2014-09-08 | Discharge: 2014-09-08 | Disposition: A | Discharge status: Home / Self Care | Payer: Medicaid Other | Source: Ambulatory Visit | Attending: Family Medicine | Admitting: Family Medicine

## 2014-09-08 ENCOUNTER — Other Ambulatory Visit: Payer: Self-pay | Admitting: Family Medicine

## 2014-09-08 DIAGNOSIS — M79671 Pain in right foot: Secondary | ICD-10-CM

## 2014-09-12 LAB — URINE CYTOLOGY ANCILLARY ONLY
Bacterial vaginitis: NEGATIVE
CANDIDA VAGINITIS: NEGATIVE
CHLAMYDIA, DNA PROBE: NEGATIVE
NEISSERIA GONORRHEA: NEGATIVE
Trichomonas: NEGATIVE

## 2014-09-25 ENCOUNTER — Encounter (HOSPITAL_COMMUNITY): Payer: Self-pay | Admitting: Emergency Medicine

## 2014-09-25 ENCOUNTER — Observation Stay (HOSPITAL_COMMUNITY)
Admission: EM | Admit: 2014-09-25 | Discharge: 2014-09-26 | Disposition: A | Discharge status: Home / Self Care | Payer: Medicaid Other | Attending: Internal Medicine | Admitting: Internal Medicine

## 2014-09-25 DIAGNOSIS — Z79899 Other long term (current) drug therapy: Secondary | ICD-10-CM | POA: Diagnosis not present

## 2014-09-25 DIAGNOSIS — R112 Nausea with vomiting, unspecified: Secondary | ICD-10-CM | POA: Insufficient documentation

## 2014-09-25 DIAGNOSIS — D72829 Elevated white blood cell count, unspecified: Secondary | ICD-10-CM | POA: Diagnosis present

## 2014-09-25 DIAGNOSIS — T620X1A Toxic effect of ingested mushrooms, accidental (unintentional), initial encounter: Secondary | ICD-10-CM | POA: Diagnosis not present

## 2014-09-25 DIAGNOSIS — R197 Diarrhea, unspecified: Secondary | ICD-10-CM | POA: Insufficient documentation

## 2014-09-25 DIAGNOSIS — Y92830 Public park as the place of occurrence of the external cause: Secondary | ICD-10-CM | POA: Diagnosis not present

## 2014-09-25 HISTORY — DX: Other specified health status: Z78.9

## 2014-09-25 LAB — CBC WITH DIFFERENTIAL/PLATELET
BASOS ABS: 0 10*3/uL (ref 0.0–0.1)
Basophils Relative: 0 % (ref 0–1)
EOS ABS: 0.3 10*3/uL (ref 0.0–0.7)
EOS PCT: 2 % (ref 0–5)
HCT: 43.3 % (ref 36.0–46.0)
Hemoglobin: 14.4 g/dL (ref 12.0–15.0)
LYMPHS ABS: 1.1 10*3/uL (ref 0.7–4.0)
LYMPHS PCT: 7 % — AB (ref 12–46)
MCH: 26.4 pg (ref 26.0–34.0)
MCHC: 33.3 g/dL (ref 30.0–36.0)
MCV: 79.3 fL (ref 78.0–100.0)
Monocytes Absolute: 1 10*3/uL (ref 0.1–1.0)
Monocytes Relative: 6 % (ref 3–12)
NEUTROS PCT: 85 % — AB (ref 43–77)
Neutro Abs: 14.9 10*3/uL — ABNORMAL HIGH (ref 1.7–7.7)
PLATELETS: 370 10*3/uL (ref 150–400)
RBC: 5.46 MIL/uL — AB (ref 3.87–5.11)
RDW: 13.4 % (ref 11.5–15.5)
WBC: 17.3 10*3/uL — AB (ref 4.0–10.5)

## 2014-09-25 LAB — SALICYLATE LEVEL: Salicylate Lvl: <2 mg/dL — ABNORMAL LOW (ref 2.8–20.0)

## 2014-09-25 LAB — COMPREHENSIVE METABOLIC PANEL
ALK PHOS: 89 U/L (ref 39–117)
ALT: 27 U/L (ref 0–35)
AST: 21 U/L (ref 0–37)
Albumin: 4.4 g/dL (ref 3.5–5.2)
Anion gap: 17 — ABNORMAL HIGH (ref 5–15)
BUN: 17 mg/dL (ref 6–23)
CALCIUM: 9.9 mg/dL (ref 8.4–10.5)
CO2: 23 mEq/L (ref 19–32)
Chloride: 94 mEq/L — ABNORMAL LOW (ref 96–112)
Creatinine, Ser: 0.64 mg/dL (ref 0.50–1.10)
GFR calc Af Amer: >90 mL/min (ref >90)
GFR calc non Af Amer: >90 mL/min (ref >90)
GLUCOSE: 129 mg/dL — AB (ref 70–99)
POTASSIUM: 3.8 meq/L (ref 3.7–5.3)
SODIUM: 134 meq/L — AB (ref 137–147)
TOTAL PROTEIN: 8.6 g/dL — AB (ref 6.0–8.3)
Total Bilirubin: 0.3 mg/dL (ref 0.3–1.2)

## 2014-09-25 LAB — PREGNANCY, URINE: PREG TEST UR: NEGATIVE

## 2014-09-25 LAB — RAPID URINE DRUG SCREEN, HOSP PERFORMED
Amphetamines: NOT DETECTED
Barbiturates: NOT DETECTED
Benzodiazepines: NOT DETECTED
Cocaine: NOT DETECTED
Opiates: NOT DETECTED
Tetrahydrocannabinol: NOT DETECTED

## 2014-09-25 LAB — ACETAMINOPHEN LEVEL: Acetaminophen (Tylenol), Serum: <15 ug/mL (ref 10–30)

## 2014-09-25 LAB — ETHANOL: Alcohol, Ethyl (B): <11 mg/dL (ref 0–11)

## 2014-09-25 MED ORDER — SODIUM CHLORIDE 0.9 % IV BOLUS (SEPSIS)
1000.0000 mL | Freq: Once | INTRAVENOUS | Status: AC
  Administered 2014-09-25: 1000 mL via INTRAVENOUS

## 2014-09-25 MED ORDER — ONDANSETRON HCL 4 MG/2ML IJ SOLN
4.0000 mg | Freq: Once | INTRAMUSCULAR | Status: AC
  Administered 2014-09-25: 4 mg via INTRAVENOUS
  Filled 2014-09-25: qty 2

## 2014-09-25 NOTE — ED Notes (Signed)
Pt arrived via [place]. Pt states she ingested wild mushrooms about 3 hours ago. Pt states she is current feeling lightheaded, with complaints of vomiting, diarrhea, and legs weak. Pt states she drank a lot of water since the ingestion. Pt states that she had a diarrhea about 45 min ago. Pt is alter and oriented x 4.

## 2014-09-25 NOTE — ED Provider Notes (Signed)
CSN: [id number]     Arrival date & time 09/25/14  2145 History   First MD Initiated Contact with Patient 09/25/14 2154     Chief Complaint  Patient presents with  . Ingestion    Wild mushrooms      (Consider location/radiation/quality/duration/timing/severity/associated sxs/prior Treatment) HPI Comments: The patient is a 21 or a female presenting to emergency room chief complaint of purposeful mushroom and just at approximately 1700 tonight. Patient reports [place] 1 [place] the park. Patient is unsure what type of mushroom. Denies other identifying features.  Patient denies coingestion. She reports onset of vomiting and diarrhea at approximately 1800.  Reports greater than 6 episodes of emesis. Aproximately 6 episodes of diarrhea. She reports associated abdominal cramping. Denies co-ingestion of other substances.  Patient is a 38 y.o. female presenting with Ingested Medication. The history is provided by the patient. No language interpreter was used.  Ingestion Associated symptoms include abdominal pain, nausea and vomiting. Pertinent negatives include no chills or fever.    History reviewed. No pertinent past medical history. History reviewed. No pertinent past surgical history. No family history on file. History  Substance Use Topics  . Smoking status: Never Smoker   . Smokeless tobacco: Not on file  . Alcohol Use: No   OB History   Grav Para Term Preterm Abortions TAB SAB Ect Mult Living                 Review of Systems  Constitutional: Negative for fever and chills.  Eyes: Negative for photophobia and visual disturbance.  Gastrointestinal: Positive for nausea, vomiting, abdominal pain and diarrhea.  Skin: Negative for color change.      Allergies  Review of patient's allergies indicates no known allergies.  Home Medications   Prior to Admission medications   Medication Sig Start Date End Date Taking? Authorizing Provider  albuterol (PROVENTIL  HFA;VENTOLIN HFA) 108 (90 BASE) MCG/ACT inhaler Inhale 2 puffs into the lungs every 4 (four) hours as needed for wheezing. 04/15/13  Yes Janne Napoleon, NP  LORazepam (ATIVAN) 1 MG tablet Take 1 mg by mouth at bedtime.   Yes Historical Provider, MD   BP 112/72  Pulse 100  Temp(Src) 98.7 F (37.1 C) (Oral)  Resp 18  Ht 5\' 2"  (1.575 m)  Wt 160 lb (72.576 kg)  BMI 29.26 kg/m2  SpO2 98%  LMP 09/18/2014 Physical Exam  Nursing note and vitals reviewed. Constitutional: She is oriented to person, place, and time. She appears well-developed and well-nourished. No distress.  HENT:  Head: Normocephalic and atraumatic.  Eyes: EOM are normal. Pupils are equal, round, and reactive to light.  Neck: Neck supple.  Cardiovascular: Regular rhythm.  Tachycardia present.   Pulmonary/Chest: Not tachypneic. No respiratory distress. She has no decreased breath sounds. She has no wheezes. She has no rhonchi. She has no rales.  Abdominal: Soft. There is generalized tenderness. There is no rigidity, no rebound, no guarding and no CVA tenderness.  Neurological: She is alert and oriented to person, place, and time.  Skin: Skin is warm and dry. She is not diaphoretic.  Psychiatric: She has a normal mood and affect. Her behavior is normal.    ED Course  Procedures (including critical care time) Labs Review Labs Reviewed  CBC WITH DIFFERENTIAL - Abnormal; Notable for the following:    WBC 17.3 (*)    RBC 5.46 (*)    Neutrophils Relative % 85 (*)    Neutro Abs 14.9 (*)  Lymphocytes Relative 7 (*)    All other components within normal limits  COMPREHENSIVE METABOLIC PANEL - Abnormal; Notable for the following:    Sodium 134 (*)    Chloride 94 (*)    Glucose, Bld 129 (*)    Total Protein 8.6 (*)    Anion gap 17 (*)    All other components within normal limits  URINALYSIS, ROUTINE W REFLEX MICROSCOPIC - Abnormal; Notable for the following:    Color, Urine AMBER (*)    APPearance CLOUDY (*)    Leukocytes,  UA TRACE (*)    All other components within normal limits  SALICYLATE LEVEL - Abnormal; Notable for the following:    Salicylate Lvl <5.4 (*)    All other components within normal limits  URINE MICROSCOPIC-ADD ON - Abnormal; Notable for the following:    Squamous Epithelial / LPF FEW (*)    Bacteria, UA MANY (*)    All other components within normal limits  PREGNANCY, URINE  ACETAMINOPHEN LEVEL  URINE RAPID DRUG SCREEN (HOSP PERFORMED)  ETHANOL    Imaging Review No results found.   EKG Interpretation None      MDM   Final diagnoses:  Toxic effect of ingested mushroom, accidental or unintentional, initial encounter   Patient presents with purposeful ingestion of mushrooms, vomiting, diarrhea after. Fluids given. Poison control contacted. CBC shows leukocytosis at 17.3, no LFT abnormalities. Dr. Colin Rhein also evaluated the patient during this encounter. Reevaluation patient resting in room denies further emesis, persistently tachycardic at 110's. Discussed admission overnight observation poison control advised.  Pt agrees after hearing about sons's labs results and history for ingestion of same mushroom. Discussed with Dr. Cyndia Diver who agrees to admit the patient. Meds given in ED:  Medications  sodium chloride 0.9 % bolus 1,000 mL (0 mLs Intravenous Stopped 09/25/14 2358)  ondansetron (ZOFRAN) injection 4 mg (4 mg Intravenous Given 09/25/14 2216)  sodium chloride 0.9 % bolus 1,000 mL (0 mLs Intravenous Stopped 09/26/14 0127)  sodium chloride 0.9 % bolus 1,000 mL (0 mLs Intravenous Stopped 09/26/14 0127)    New Prescriptions   No medications on file         Harvie Heck, PA-C 09/26/14 0156

## 2014-09-25 NOTE — ED Notes (Signed)
Bed: WA17 Expected date:  Expected time:  Means of arrival:  Comments: EMS 38 yo female ingested wild mushroom from park

## 2014-09-25 NOTE — ED Notes (Signed)
Called [employer]. Suggest checking electrolytes, ALT, AST; treat symptoms; give IV fluids. CPD state it is a good sign pt is sick w/i first 6 hours of ingestion.

## 2014-09-25 NOTE — ED Notes (Signed)
Pt states she ingested a whole mushroom at the park earlier today. Pt states she has been having an increase in lightheadedness, vomiting, diarrhea, and weak. Pt is tearful on circumstance. Pt is alert and oriented x 4. Pt family at bedside. Will continue to monitor. Awaiting orders from provider. Will contact Poison Control and await suggested orders.

## 2014-09-26 ENCOUNTER — Encounter (HOSPITAL_COMMUNITY): Payer: Self-pay | Admitting: Internal Medicine

## 2014-09-26 DIAGNOSIS — D72829 Elevated white blood cell count, unspecified: Secondary | ICD-10-CM

## 2014-09-26 DIAGNOSIS — T620X1A Toxic effect of ingested mushrooms, accidental (unintentional), initial encounter: Secondary | ICD-10-CM | POA: Diagnosis present

## 2014-09-26 LAB — HEPATIC FUNCTION PANEL
ALBUMIN: 3 g/dL — AB (ref 3.5–5.2)
ALT: 17 U/L (ref 0–35)
AST: 14 U/L (ref 0–37)
Alkaline Phosphatase: 67 U/L (ref 39–117)
Bilirubin, Direct: <0.2 mg/dL (ref 0.0–0.3)
TOTAL PROTEIN: 6.1 g/dL (ref 6.0–8.3)
Total Bilirubin: 0.5 mg/dL (ref 0.3–1.2)

## 2014-09-26 LAB — URINE MICROSCOPIC-ADD ON

## 2014-09-26 LAB — URINALYSIS, ROUTINE W REFLEX MICROSCOPIC
BILIRUBIN URINE: NEGATIVE
GLUCOSE, UA: NEGATIVE mg/dL
HGB URINE DIPSTICK: NEGATIVE
Ketones, ur: NEGATIVE mg/dL
Nitrite: NEGATIVE
PH: 5.5 (ref 5.0–8.0)
Protein, ur: NEGATIVE mg/dL
SPECIFIC GRAVITY, URINE: 1.022 (ref 1.005–1.030)
UROBILINOGEN UA: 0.2 mg/dL (ref 0.0–1.0)

## 2014-09-26 LAB — BASIC METABOLIC PANEL
ANION GAP: 11 (ref 5–15)
BUN: 13 mg/dL (ref 6–23)
CALCIUM: 7.9 mg/dL — AB (ref 8.4–10.5)
CHLORIDE: 105 meq/L (ref 96–112)
CO2: 23 meq/L (ref 19–32)
Creatinine, Ser: 0.6 mg/dL (ref 0.50–1.10)
GFR calc Af Amer: >90 mL/min (ref >90)
GFR calc non Af Amer: >90 mL/min (ref >90)
GLUCOSE: 106 mg/dL — AB (ref 70–99)
POTASSIUM: 4.1 meq/L (ref 3.7–5.3)
SODIUM: 139 meq/L (ref 137–147)

## 2014-09-26 LAB — CBC WITH DIFFERENTIAL/PLATELET
Basophils Absolute: 0 10*3/uL (ref 0.0–0.1)
Basophils Relative: 0 % (ref 0–1)
EOS PCT: 2 % (ref 0–5)
Eosinophils Absolute: 0.2 10*3/uL (ref 0.0–0.7)
HEMATOCRIT: 35.3 % — AB (ref 36.0–46.0)
HEMOGLOBIN: 11.4 g/dL — AB (ref 12.0–15.0)
LYMPHS ABS: 2.2 10*3/uL (ref 0.7–4.0)
LYMPHS PCT: 18 % (ref 12–46)
MCH: 25.7 pg — ABNORMAL LOW (ref 26.0–34.0)
MCHC: 32.3 g/dL (ref 30.0–36.0)
MCV: 79.7 fL (ref 78.0–100.0)
MONO ABS: 0.6 10*3/uL (ref 0.1–1.0)
Monocytes Relative: 5 % (ref 3–12)
NEUTROS ABS: 9.1 10*3/uL — AB (ref 1.7–7.7)
Neutrophils Relative %: 75 % (ref 43–77)
Platelets: 304 10*3/uL (ref 150–400)
RBC: 4.43 MIL/uL (ref 3.87–5.11)
RDW: 13.7 % (ref 11.5–15.5)
WBC: 12.2 10*3/uL — AB (ref 4.0–10.5)

## 2014-09-26 LAB — PROTIME-INR
INR: 1.18 (ref 0.00–1.49)
Prothrombin Time: 15 seconds (ref 11.6–15.2)

## 2014-09-26 MED ORDER — SODIUM CHLORIDE 0.9 % IJ SOLN: 3.0000 mL | Freq: Two times a day (BID) | INTRAMUSCULAR | Status: DC

## 2014-09-26 MED ORDER — LORAZEPAM 1 MG PO TABS
1.0000 mg | ORAL_TABLET | Freq: Every day | ORAL | Status: DC
  Administered 2014-09-26: 1 mg via ORAL
  Filled 2014-09-26: qty 1

## 2014-09-26 MED ORDER — ALBUTEROL SULFATE (2.5 MG/3ML) 0.083% IN NEBU: 3.0000 mL | INHALATION_SOLUTION | RESPIRATORY_TRACT | Status: DC | PRN

## 2014-09-26 MED ORDER — ONDANSETRON HCL 4 MG PO TABS: 4.0000 mg | ORAL_TABLET | Freq: Four times a day (QID) | ORAL | Status: DC | PRN

## 2014-09-26 MED ORDER — ONDANSETRON HCL 4 MG/2ML IJ SOLN: 4.0000 mg | Freq: Four times a day (QID) | INTRAMUSCULAR | Status: DC | PRN

## 2014-09-26 MED ORDER — SODIUM CHLORIDE 0.9 % IV SOLN
INTRAVENOUS | Status: DC
Start: 2014-09-26 — End: 2014-09-26
  Administered 2014-09-26: 04:00:00 via INTRAVENOUS

## 2014-09-26 MED ORDER — SODIUM CHLORIDE 0.9 % IV SOLN: INTRAVENOUS | Status: AC

## 2014-09-26 NOTE — Progress Notes (Signed)
Patient admitted after midnight by Dr. Raliegh Ip.  Please see H&p.  Poison control involved.  Patient sleeping 2/3 kids at bedside.  Son admitted to Pediatrics Will get CMP and CBC in aM- need to monitor LFTs/renal function -advance diet Angela Bear DO

## 2014-09-26 NOTE — Progress Notes (Signed)
Paged Dr. Eliseo Squires poison control Rondall Allegra [phone] and need order for pt to leave floor to visit son in peds.

## 2014-09-26 NOTE — ED Notes (Signed)
[facility] called. 

## 2014-09-26 NOTE — Clinical Social Work Note (Signed)
CSW and Peds CSW Sharyn Lull talked with patient about sequence of events that led to she and son's hospitalization. Patient reported that she, son and youngest daughter were at the park waiting for her 36 year old daughter to get out of school. It was at the park that she saw and ate a mushroom, as did her son. They did not begin to feel ill until they got home. Patient has been discharged, however her son is not yet medically stable. Patient provided with cab voucher for transport home. She was also given a letter for her daughter to take to school on Wednesday regarding her being out of school on today, 10/6.  Charon Smedberg Givens, MSW, LCSW [phone]

## 2014-09-26 NOTE — Progress Notes (Signed)
New Admission Note:   Arrival Method: [facility] Mental Orientation: alert and orientedx4 Telemetry: NSR Assessment: Completed Skin: NO BREAKDOWN NOTED IV: NS@100  Pain: DENIES Tubes: NONE Safety Measures: Safety Fall Prevention Plan has been given, discussed and signed Admission: Completed [address] Orientation: Patient has been orientated to the room, unit and staff.  Family: 36YR OLD AND 87YR DAUGHTERS IN ROOM WITH PATIENT. AC AWARE  Orders have been reviewed and implemented. Will continue to monitor the patient. Call light has been placed within reach and bed alarm has been activated.   Dudley Major BSN, Consulting civil engineer number: [phone]

## 2014-09-26 NOTE — Progress Notes (Signed)
Pt discharge instructions given, pt verbalized understanding. VSS. Denies pain. Pt escorted to [facility] to visit son in PICU.

## 2014-09-26 NOTE — ED Notes (Signed)
Report given to floor

## 2014-09-26 NOTE — Progress Notes (Signed)
Pt awake and alert, oriented. Pt speaking on phone. Children at bedside.

## 2014-09-26 NOTE — Discharge Summary (Signed)
Physician Discharge Summary  Angela Romero [id number] DOB: [birth year] DOA: 09/25/2014  PCP: Ellsworth Lennox, MD  Admit date: 09/25/2014 Discharge date: 09/26/2014  Time spent: 35 minutes  Recommendations for Outpatient Follow-up:    Discharge Diagnoses:  Principal Problem:   Mushroom poisoning Active Problems:   Leucocytosis   Discharge Condition: improved  Diet recommendation: regular store bought foods  Filed Weights   09/25/14 2144 09/26/14 0348  Weight: 72.576 kg (160 lb) 72.5 kg (159 lb 13.3 oz)    History of present illness:  Angela Romero is a 38 y.o. female with no significant past medical history presents to the ER with complaints of nausea vomiting and diarrhea. Patient last evening had consumed some mushrooms from the wild. 2 hours later patient started having symptoms of nausea vomiting and diarrhea with crampy abdominal pain and patient presented to the ER. Patient's son also had consumed at the same time and had similar symptoms as per the patient. In the ER lab works reveal leukocytosis and was initially tachycardic. Patient's heart rate improved with IV fluids. Patient was given 3 L normal saline bolus. Poison control at this time as requested to observe patient and closely observe for any worsening or LFTs or renal function. Patient otherwise denies any chest pain shortness of breath headache visual symptoms any focal deficits. Patient denies having consumed any other medications or product. Denies any suicidal or homicidal ideations.    Hospital Course:  Accidental mushroom poisoning- spoke with Poison control- patient's labs are stable, no symptoms-- she may be d/c'd  Procedures:  none  Consultations:  Poison control  Discharge Exam: Filed Vitals:   09/26/14 0941  BP: 108/65  Pulse: 88  Temp: 98.5 F (36.9 C)  Resp: 18    General: A+Ox3, NAD Cardiovascular: rrr Respiratory: clear  Discharge Instructions You were cared for by a  hospitalist during your hospital stay. If you have any questions about your discharge medications or the care you received while you were in the hospital after you are discharged, you can call the unit and asked to speak with the hospitalist on call if the hospitalist that took care of you is not available. Once you are discharged, your primary care physician will handle any further medical issues. Please note that NO REFILLS for any discharge medications will be authorized once you are discharged, as it is imperative that you return to your primary care physician (or establish a relationship with a primary care physician if you do not have one) for your aftercare needs so that they can reassess your need for medications and monitor your lab values.  Discharge Instructions   Diet general    Complete by:  As directed      Increase activity slowly    Complete by:  As directed           Current Discharge Medication List    CONTINUE these medications which have NOT CHANGED   Details  albuterol (PROVENTIL HFA;VENTOLIN HFA) 108 (90 BASE) MCG/ACT inhaler Inhale 2 puffs into the lungs every 4 (four) hours as needed for wheezing. Qty: 1 Inhaler, Refills: 0    LORazepam (ATIVAN) 1 MG tablet Take 1 mg by mouth at bedtime.       No Known Allergies Follow-up Information   Follow up with [facility], MD In 1 week.   Specialty:  Family Medicine   Contact information:   [place] [address] [phone]        The results of significant diagnostics from  this hospitalization (including imaging, microbiology, ancillary and laboratory) are listed below for reference.    Significant Diagnostic Studies: Dg Os Calcis Right  09/08/2014   CLINICAL DATA:  Right foot pain  EXAM: RIGHT OS CALCIS - 2+ VIEW  COMPARISON:  None.  FINDINGS: Two views of the right calcaneus submitted. No acute fracture or subluxation. A skin marker is noted posteriorly. No radiopaque foreign body.   IMPRESSION: No acute fracture or subluxation.  No radiopaque foreign body.   Electronically Signed   By: Lahoma Crocker M.D.   On: 09/08/2014 12:21   Dg Foot Complete Right  09/08/2014   CLINICAL DATA:  Right foot pain  EXAM: RIGHT FOOT COMPLETE - 3+ VIEW  COMPARISON:  None.  FINDINGS: Three views of the right foot submitted. No acute fracture or subluxation. No radiopaque foreign body is noted. A skin marker is noted posteriorly.  IMPRESSION: Negative.   Electronically Signed   By: Lahoma Crocker M.D.   On: 09/08/2014 12:22    Microbiology: No results found for this or any previous visit (from the past 240 hour(s)).   Labs: Basic Metabolic Panel:  Recent Labs Lab 09/25/14 2206 09/26/14 0535  NA 134* 139  K 3.8 4.1  CL 94* 105  CO2 23 23  GLUCOSE 129* 106*  BUN 17 13  CREATININE 0.64 0.60  CALCIUM 9.9 7.9*   Liver Function Tests:  Recent Labs Lab 09/25/14 2206 09/26/14 0535  AST 21 14  ALT 27 17  ALKPHOS 89 67  BILITOT 0.3 0.5  PROT 8.6* 6.1  ALBUMIN 4.4 3.0*   No results found for this basename: LIPASE, AMYLASE,  in the last 168 hours No results found for this basename: AMMONIA,  in the last 168 hours CBC:  Recent Labs Lab 09/25/14 2206 09/26/14 0535  WBC 17.3* 12.2*  NEUTROABS 14.9* 9.1*  HGB 14.4 11.4*  HCT 43.3 35.3*  MCV 79.3 79.7  PLT 370 304   Cardiac Enzymes: No results found for this basename: CKTOTAL, CKMB, CKMBINDEX, TROPONINI,  in the last 168 hours BNP: BNP (last 3 results) No results found for this basename: PROBNP,  in the last 8760 hours CBG: No results found for this basename: GLUCAP,  in the last 168 hours     Signed:  Eulogio Bear  [facility] 09/26/2014, 2:54 PM

## 2014-09-26 NOTE — H&P (Signed)
[facility] History and Physical  Angela Romero [id number] DOB: 08-03-1976 DOA: 09/25/2014  Referring physician: ER physician. PCP: Ellsworth Lennox, MD   Chief Complaint: Nausea vomiting and diarrhea.  HPI: Angela Romero is a 38 y.o. female with no significant past medical history presents to the ER with complaints of nausea vomiting and diarrhea. Patient last evening had consumed some mushrooms from the wild. 2 hours later patient started having symptoms of nausea vomiting and diarrhea with crampy abdominal pain and patient presented to the ER. Patient's son also had consumed at the same time and had similar symptoms as per the patient. In the ER lab works reveal leukocytosis and was initially tachycardic. Patient's heart rate improved with IV fluids. Patient was given 3 L normal saline bolus. Poison control at this time as requested to observe patient and closely observe for any worsening or LFTs or renal function. Patient otherwise denies any chest pain shortness of breath headache visual symptoms any focal deficits. Patient denies having consumed any other medications or product. Denies any suicidal or homicidal ideations.   Review of Systems: As presented in the history of presenting illness, rest negative.  Past Medical History  Diagnosis Date  . Medical history non-contributory    Past Surgical History  Procedure Laterality Date  . Inner ear surgery    . Eye surgery     Social History:  reports that she has never smoked. She does not have any smokeless tobacco history on file. She reports that she does not drink alcohol or use illicit drugs. Where does patient live home. Can patient participate in ADLs? Yes.  No Known Allergies  Family History:  Family History  Problem Relation Age of Onset  . Diabetes Mellitus II Mother       Prior to Admission medications   Medication Sig Start Date End Date Taking? Authorizing Provider  albuterol (PROVENTIL  HFA;VENTOLIN HFA) 108 (90 BASE) MCG/ACT inhaler Inhale 2 puffs into the lungs every 4 (four) hours as needed for wheezing. 04/15/13  Yes Janne Napoleon, NP  LORazepam (ATIVAN) 1 MG tablet Take 1 mg by mouth at bedtime.   Yes Historical Provider, MD    Physical Exam: Filed Vitals:   09/25/14 2330 09/26/14 0000 09/26/14 0030 09/26/14 0100  BP: 116/67 115/66 110/59 109/51  Pulse: 95 97 97 98  Temp:      TempSrc:      Resp: 19 18 20 20   Height:      Weight:      SpO2: 96% 93% 94% 97%     General:  Well-developed and nourished.  Eyes: Anicteric no pallor.  ENT: No discharge from the ears eyes nose mouth.  Neck: No mass felt.  Cardiovascular: S1-S2 heard.  Respiratory: No rhonchi or crepitations.  Abdomen: Soft nontender bowel sounds present no guarding or rigidity.  Skin: No rash.  Musculoskeletal: No edema.  Psychiatric: Appears normal.  Neurologic: Alert awake oriented to time place and person. Moves all extremities.  Labs on Admission:  Basic Metabolic Panel:  Recent Labs Lab 09/25/14 2206  NA 134*  K 3.8  CL 94*  CO2 23  GLUCOSE 129*  BUN 17  CREATININE 0.64  CALCIUM 9.9   Liver Function Tests:  Recent Labs Lab 09/25/14 2206  AST 21  ALT 27  ALKPHOS 89  BILITOT 0.3  PROT 8.6*  ALBUMIN 4.4   No results found for this basename: LIPASE, AMYLASE,  in the last 168 hours No results found for this basename: AMMONIA,  in  the last 168 hours CBC:  Recent Labs Lab 09/25/14 2206  WBC 17.3*  NEUTROABS 14.9*  HGB 14.4  HCT 43.3  MCV 79.3  PLT 370   Cardiac Enzymes: No results found for this basename: CKTOTAL, CKMB, CKMBINDEX, TROPONINI,  in the last 168 hours  BNP (last 3 results) No results found for this basename: PROBNP,  in the last 8760 hours CBG: No results found for this basename: GLUCAP,  in the last 168 hours  Radiological Exams on Admission: No results found.  EKG: Independently reviewed. Sinus  tachycardia.  Assessment/Plan Principal Problem:   Mushroom poisoning Active Problems:   Leucocytosis   1. Mushroom poisoning - I have discussed with poison control and at this time they have requested to closely follow LFTs and renal function. Continue with hydration. Patient at this time is able to tolerate clear liquids which will be continued. Patient denies any homicidal or suicidal ideations. 2. Leukocytosis - probably reactionary. Closely follow CBC.  Since patient's son is also being admitted and will be transferred to [facility] pediatrics, patient also will be transferred to [facility]. Patient is in agreement with transfer. Dr. Posey Pronto will be the accepting physician.   Code Status: Full code.  Family Communication: None.  Disposition Plan: Admit to inpatient.    Angela Romero N. [facility] Pager [phone].  If 7PM-7AM, please contact night-coverage [email or web address] Password [email or web address] 09/26/2014, 1:30 AM

## 2014-09-26 NOTE — Progress Notes (Signed)
UR completed 

## 2014-09-26 NOTE — Progress Notes (Signed)
Poison control called spoke with poison control, stated they would continue to monitor and see if she stays another day and go from there.

## 2014-09-26 NOTE — Progress Notes (Signed)
Pt requested to be discharged as soon as possible.  Wants to visit and talk with son.  Paged Dr. Legrand Rams

## 2014-09-28 NOTE — ED Provider Notes (Signed)
Medical screening examination/treatment/procedure(s) were conducted as a shared visit with non-physician practitioner(s) and myself.  I personally evaluated the patient during the encounter.   EKG Interpretation   Date/Time:  Monday September 25 2014 21:44:58 EDT Ventricular Rate:  116 PR Interval:  161 QRS Duration: 85 QT Interval:  329 QTC Calculation: 457 R Axis:   60 Text Interpretation:  Age not entered, assumed to be  38 years old for  purpose of ECG interpretation Sinus tachycardia Borderline T  abnormalities, diffuse leads ED PHYSICIAN INTERPRETATION AVAILABLE IN [facility] Confirmed by TEST, Record ([id number]) on 09/27/2014 6:56:45 AM      Briefly, pt is a 38 y.o. female presenting with n/v/d after ingesting unknown wild mushrooms.  Pt stated that she ate similar looking mushrooms in [place], where she is from.  I performed an examination on the patient including cardiac, pulmonary, and gi systems which were remarkable for mild generalized abd tenderness.  Spoke with poison control who recommend observation only, no acute therapy.  Admitted for monitoring.     Debby Freiberg, MD 09/28/14 [phone]

## 2015-01-30 ENCOUNTER — Emergency Department (HOSPITAL_COMMUNITY)
Admission: EM | Admit: 2015-01-30 | Discharge: 2015-01-31 | Disposition: A | Discharge status: Home / Self Care | Payer: Medicaid Other | Attending: Emergency Medicine | Admitting: Emergency Medicine

## 2015-01-30 ENCOUNTER — Encounter (HOSPITAL_COMMUNITY): Payer: Self-pay | Admitting: Emergency Medicine

## 2015-01-30 DIAGNOSIS — Y998 Other external cause status: Secondary | ICD-10-CM | POA: Diagnosis not present

## 2015-01-30 DIAGNOSIS — Z23 Encounter for immunization: Secondary | ICD-10-CM | POA: Diagnosis not present

## 2015-01-30 DIAGNOSIS — X58XXXA Exposure to other specified factors, initial encounter: Secondary | ICD-10-CM | POA: Insufficient documentation

## 2015-01-30 DIAGNOSIS — Y9389 Activity, other specified: Secondary | ICD-10-CM | POA: Diagnosis not present

## 2015-01-30 DIAGNOSIS — S0501XA Injury of conjunctiva and corneal abrasion without foreign body, right eye, initial encounter: Secondary | ICD-10-CM | POA: Diagnosis not present

## 2015-01-30 DIAGNOSIS — Y9289 Other specified places as the place of occurrence of the external cause: Secondary | ICD-10-CM | POA: Insufficient documentation

## 2015-01-30 DIAGNOSIS — H5711 Ocular pain, right eye: Secondary | ICD-10-CM | POA: Diagnosis present

## 2015-01-30 MED ORDER — TETANUS-DIPHTH-ACELL PERTUSSIS 5-2.5-18.5 LF-MCG/0.5 IM SUSP
0.5000 mL | Freq: Once | INTRAMUSCULAR | Status: AC
  Administered 2015-01-31: 0.5 mL via INTRAMUSCULAR
  Filled 2015-01-30: qty 0.5

## 2015-01-30 MED ORDER — FLUORESCEIN SODIUM 1 MG OP STRP
1.0000 | ORAL_STRIP | Freq: Once | OPHTHALMIC | Status: AC
  Administered 2015-01-31: 1 via OPHTHALMIC
  Filled 2015-01-30: qty 1

## 2015-01-30 MED ORDER — PROPARACAINE HCL 0.5 % OP SOLN
1.0000 [drp] | Freq: Once | OPHTHALMIC | Status: AC
Start: 2015-01-30 — End: 2015-01-30
  Administered 2015-01-30: 1 [drp] via OPHTHALMIC
  Filled 2015-01-30: qty 15

## 2015-01-30 MED ORDER — HYDROCODONE-ACETAMINOPHEN 5-325 MG PO TABS
1.0000 | ORAL_TABLET | Freq: Once | ORAL | Status: AC
  Administered 2015-01-30: 1 via ORAL
  Filled 2015-01-30: qty 1

## 2015-01-30 NOTE — ED Provider Notes (Signed)
CSN: [id number]     Arrival date & time 01/30/15  2245 History  This chart was scribed for non-physician practitioner, Britt Bottom, NP, working with Everlene Balls, MD, by Jeanell Sparrow, ED Scribe. This patient was seen in room WTR7/WTR7 and the patient's care was started at 11:11 PM.  Chief Complaint  Patient presents with  . Eye Pain   The history is provided by the patient. No language interpreter was used.   HPI Comments: Angela Romero is a 40 y.o. female who presents to the Emergency Department complaining of constant moderate right eye pain that started about 6 hours ago. She reports that she was rubbing her eye when a lash got stuck in her eyelid. She states that her right eye now has watery discharge. She currently rates the severity of her pain as a 9/10. She reports no modifying factors for the pain. She states that she is unsure of her tetanus status.   History reviewed. No pertinent past medical history. Past Surgical History  Procedure Laterality Date  . Refractive surgery     No family history on file. History  Substance Use Topics  . Smoking status: Never Smoker   . Smokeless tobacco: Never Used  . Alcohol Use: No   OB History    No data available     Review of Systems  Eyes: Positive for pain, discharge (watery) and redness.  Gastrointestinal: Negative for nausea.  Neurological: Negative for headaches.    Allergies  Review of patient's allergies indicates no known allergies.  Home Medications   Prior to Admission medications   Not on File   BP 126/73 mmHg  Pulse 100  Temp(Src) 97.8 F (36.6 C) (Oral)  Resp 18  SpO2 97%  LMP 01/08/2015 (Approximate) Physical Exam  Constitutional: She is oriented to person, place, and time. She appears well-developed and well-nourished. No distress.  HENT:  Head: Normocephalic and atraumatic.  Eyes: EOM are normal. Pupils are equal, round, and reactive to light. Right conjunctiva is injected.  Slit lamp exam:       The right eye shows corneal abrasion and fluorescein uptake.       Neck: Neck supple. No tracheal deviation present.  Cardiovascular: Normal rate.   Pulmonary/Chest: Effort normal. No respiratory distress.  Musculoskeletal: Normal range of motion.  Neurological: She is alert and oriented to person, place, and time.  Skin: Skin is warm and dry.  Psychiatric: She has a normal mood and affect. Her behavior is normal.  Nursing note and vitals reviewed.   ED Course  Procedures (including critical care time) DIAGNOSTIC STUDIES: Oxygen Saturation is 97% on RA, normal by my interpretation.    COORDINATION OF CARE: 11:15 PM- Pt advised of plan for treatment which includes medication and pt agrees.  Labs Review Labs Reviewed - No data to display  Imaging Review No results found.   EKG Interpretation None      MDM   Final diagnoses:  Corneal abrasion, right, initial encounter    39 yo with injected right conjunctiva after report of eye lash in eye.  Eye irrigated and no evidence of FB. Tdap given. Visual acuity equal bilaterally.  She does not wear contact lenses.  Corneal abrasion noted over iris. Her exam is non-concerning for orbital cellulitis, hyphema, corneal ulcers. Prescription for ciloxan and pain meds provided. Ophthalmology follow-up provided return precautions include if new symptoms develop including change in vision, purulent drainage, or entrapment. Pt aware of plan and in agreement.    I  personally performed the services described in this documentation, which was scribed in my presence. The recorded information has been reviewed and is accurate.  Filed Vitals:   01/30/15 2251 01/31/15 0019  BP: 126/73 126/70  Pulse: 100 94  Temp: 97.8 F (36.6 C)   TempSrc: Oral   Resp: 18 18  SpO2: 97% 96%   Meds given in ED:  Medications  proparacaine (ALCAINE) 0.5 % ophthalmic solution 1 drop (1 drop Right Eye Given 01/30/15 2355)  fluorescein ophthalmic strip 1 strip  (1 strip Both Eyes Given by Other 01/31/15 0018)  Tdap (BOOSTRIX) injection 0.5 mL (0.5 mLs Intramuscular Given 01/31/15 0016)  HYDROcodone-acetaminophen (NORCO/VICODIN) 5-325 MG per tablet 1 tablet (1 tablet Oral Given 01/30/15 2346)    Discharge Medication List as of 01/31/2015 12:07 AM    START taking these medications   Details  ciprofloxacin (CILOXAN) 0.3 % ophthalmic solution Place 2 drops into the right eye every 4 (four) hours. Place one drop in effected eye every 4 hours until follow up with opthalmologist, Starting 01/31/2015, Until Mon 02/05/15, Print    HYDROcodone-acetaminophen (NORCO/VICODIN) 5-325 MG per tablet Take 1 tablet by mouth every 4 (four) hours as needed for moderate pain or severe pain., Starting 01/31/2015, Until Discontinued, Print          Britt Bottom, NP 02/01/15 1546  Everlene Balls, MD 02/01/15 1620

## 2015-01-30 NOTE — ED Notes (Signed)
Pt states that she was rubbing her eye and got a lash stuck up in her eyelid. Pt's eye is now re, irritated, and watering.

## 2015-01-31 ENCOUNTER — Other Ambulatory Visit: Payer: Self-pay | Admitting: Family Medicine

## 2015-01-31 ENCOUNTER — Other Ambulatory Visit (HOSPITAL_COMMUNITY)
Admission: RE | Admit: 2015-01-31 | Discharge: 2015-01-31 | Disposition: A | Discharge status: Home / Self Care | Payer: Medicaid Other | Source: Ambulatory Visit | Attending: Family Medicine | Admitting: Family Medicine

## 2015-01-31 DIAGNOSIS — Z01419 Encounter for gynecological examination (general) (routine) without abnormal findings: Secondary | ICD-10-CM | POA: Diagnosis not present

## 2015-01-31 DIAGNOSIS — Z1151 Encounter for screening for human papillomavirus (HPV): Secondary | ICD-10-CM | POA: Diagnosis present

## 2015-01-31 MED ORDER — CIPROFLOXACIN HCL 0.3 % OP SOLN: 2.0000 [drp] | OPHTHALMIC | Status: AC

## 2015-01-31 MED ORDER — HYDROCODONE-ACETAMINOPHEN 5-325 MG PO TABS: 1.0000 | ORAL_TABLET | ORAL | Status: DC | PRN

## 2015-01-31 NOTE — Discharge Instructions (Signed)
Please follow the directions provided. Be sure to follow-up with your ophthalmologist to ensure your eyes getting better. Please use the eyedrops as directed to help prevent infection and help with healing. May use the pain medicine as needed for pain. Don't hesitate to return for any new, worsening, or concerning symptoms.   SEEK MEDICAL CARE IF:  You have pain, light sensitivity, and a scratchy feeling in one eye or both eyes.  Your pressure patch keeps loosening up, and you can blink your eye under the patch after treatment.  Any kind of discharge develops from the eye after treatment or if the lids stick together in the morning.  You have the same symptoms in the morning as you did with the original abrasion days, weeks, or months after the abrasion healed.

## 2015-02-02 ENCOUNTER — Encounter (HOSPITAL_COMMUNITY): Payer: Self-pay | Admitting: Internal Medicine

## 2015-02-02 LAB — CYTOLOGY - PAP

## 2015-02-06 ENCOUNTER — Ambulatory Visit
Admission: RE | Admit: 2015-02-06 | Discharge: 2015-02-06 | Disposition: A | Discharge status: Home / Self Care | Payer: Medicaid Other | Source: Ambulatory Visit | Attending: Family Medicine | Admitting: Family Medicine

## 2015-02-06 ENCOUNTER — Other Ambulatory Visit: Payer: Self-pay | Admitting: Family Medicine

## 2015-02-06 DIAGNOSIS — R059 Cough, unspecified: Secondary | ICD-10-CM

## 2015-02-06 DIAGNOSIS — R05 Cough: Secondary | ICD-10-CM

## 2015-02-22 ENCOUNTER — Emergency Department (HOSPITAL_COMMUNITY)
Admission: EM | Admit: 2015-02-22 | Discharge: 2015-02-22 | Disposition: A | Discharge status: Home / Self Care | Payer: Medicaid Other | Source: Home / Self Care | Attending: Family Medicine | Admitting: Family Medicine

## 2015-02-22 ENCOUNTER — Encounter (HOSPITAL_COMMUNITY): Payer: Self-pay | Admitting: *Deleted

## 2015-02-22 DIAGNOSIS — S0502XA Injury of conjunctiva and corneal abrasion without foreign body, left eye, initial encounter: Secondary | ICD-10-CM | POA: Diagnosis not present

## 2015-02-22 HISTORY — DX: Other amnesia: R41.3

## 2015-02-22 MED ORDER — EYE WASH OPHTH SOLN
OPHTHALMIC | Status: AC
  Filled 2015-02-22: qty 118

## 2015-02-22 MED ORDER — HYDROCODONE-ACETAMINOPHEN 5-325 MG PO TABS
ORAL_TABLET | ORAL | Status: AC
  Filled 2015-02-22: qty 1

## 2015-02-22 MED ORDER — TETRACAINE HCL 0.5 % OP SOLN
OPHTHALMIC | Status: AC
  Filled 2015-02-22: qty 2

## 2015-02-22 MED ORDER — TEMAZEPAM 30 MG PO CAPS: ORAL_CAPSULE | ORAL | Status: DC

## 2015-02-22 MED ORDER — TOBRAMYCIN 0.3 % OP SOLN: 1.0000 [drp] | OPHTHALMIC | Status: DC

## 2015-02-22 MED ORDER — HYDROCODONE-ACETAMINOPHEN 5-325 MG PO TABS
1.0000 | ORAL_TABLET | Freq: Once | ORAL | Status: AC
  Administered 2015-02-22: 1 via ORAL

## 2015-02-22 NOTE — Discharge Instructions (Signed)
Corneal Abrasion °The cornea is the clear covering at the front and center of the eye. When looking at the colored portion of the eye (iris), you are looking through the cornea. This very thin tissue is made up of many layers. The surface layer is a single layer of cells (corneal epithelium) and is one of the most sensitive tissues in the body. If a scratch or injury causes the corneal epithelium to come off, it is called a corneal abrasion. If the injury extends to the tissues below the epithelium, the condition is called a corneal ulcer. °CAUSES  °· Scratches. °· Trauma. °· Foreign body in the eye. °Some people have recurrences of abrasions in the area of the original injury even after it has healed (recurrent erosion syndrome). Recurrent erosion syndrome generally improves and goes away with time. °SYMPTOMS  °· Eye pain. °· Difficulty or inability to keep the injured eye open. °· The eye becomes very sensitive to light. °· Recurrent erosions tend to happen suddenly, first thing in the morning, usually after waking up and opening the eye. °DIAGNOSIS  °Your health care provider can diagnose a corneal abrasion during an eye exam. Dye is usually placed in the eye using a drop or a small paper strip moistened by your tears. When the eye is examined with a special light, the abrasion shows up clearly because of the dye. °TREATMENT  °· Small abrasions may be treated with antibiotic drops or ointment alone. °· A pressure patch may be put over the eye. If this is done, follow your doctor's instructions for when to remove the patch. Do not drive or use machines while the eye patch is on. Judging distances is hard to do with a patch on. °If the abrasion becomes infected and spreads to the deeper tissues of the cornea, a corneal ulcer can result. This is serious because it can cause corneal scarring. Corneal scars interfere with light passing through the cornea and cause a loss of vision in the involved eye. °HOME CARE  INSTRUCTIONS °· Use medicine or ointment as directed. Only take over-the-counter or prescription medicines for pain, discomfort, or fever as directed by your health care provider. °· Do not drive or operate machinery if your eye is patched. Your ability to judge distances is impaired. °· If your health care provider has given you a follow-up appointment, it is very important to keep that appointment. Not keeping the appointment could result in a severe eye infection or permanent loss of vision. If there is any problem keeping the appointment, let your health care provider know. °SEEK MEDICAL CARE IF:  °· You have pain, light sensitivity, and a scratchy feeling in one eye or both eyes. °· Your pressure patch keeps loosening up, and you can blink your eye under the patch after treatment. °· Any kind of discharge develops from the eye after treatment or if the lids stick together in the morning. °· You have the same symptoms in the morning as you did with the original abrasion days, weeks, or months after the abrasion healed. °MAKE SURE YOU:  °· Understand these instructions. °· Will watch your condition. °· Will get help right away if you are not doing well or get worse. °Document Released: 12/05/2000 Document Revised: 12/13/2013 Document Reviewed: 08/15/2013 °ExitCare® Patient Information ©2015 [employer]. This information is not intended to replace advice given to you by your health care provider. Make sure you discuss any questions you have with your health care provider. ° °

## 2015-02-22 NOTE — ED Notes (Addendum)
States baby poked her L eye at 1300.  Pain started at 1830 and took a goody powder. Pain was 9 before she took the medicine.  Feels a scratch when she blinks. Does not wear contacts.  Had Lasik surgery so she does not have to wear glasses anymore.  She has an appointment with her eye doctor in 3 weeks.  She saw her eye doctor today and but did not have pain.

## 2015-02-22 NOTE — ED Provider Notes (Signed)
CSN: [id number]     Arrival date & time 02/22/15  1955 History   First MD Initiated Contact with Patient 02/22/15 2008     Chief Complaint  Patient presents with  . Eye Pain   (Consider location/radiation/quality/duration/timing/severity/associated sxs/prior Treatment) HPI Comments: 39 year old female was in her eye doctor's office this morning around 10:00 when the daughter poked her in the left eye with her finger. She was examined by her eye doctor while there and was told that she had a corneal abrasion and that if she had any antibiotic drops at home that she could take them. The patient says st she could not find them. Her eye did not hurt at the time. Pain started approximately 2 hours prior to arrival here. Denies problems with vision.   Past Medical History  Diagnosis Date  . Medical history non-contributory   . Memory difficulty    Past Surgical History  Procedure Laterality Date  . Inner ear surgery  1996     x 4 other 3 as a child  . Eye surgery    . Refractive surgery  07/2014   Family History  Problem Relation Age of Onset  . Diabetes Mellitus II Mother   . Diabetes Mother   . Cancer Father     prostate   History  Substance Use Topics  . Smoking status: Never Smoker   . Smokeless tobacco: Never Used  . Alcohol Use: No   OB History    Gravida Para Term Preterm AB TAB SAB Ectopic Multiple Living   0 0 0 0 0 0 0 0       Review of Systems  Eyes: Positive for pain and redness. Negative for discharge and visual disturbance.  Psychiatric/Behavioral: The patient is nervous/anxious.   All other systems reviewed and are negative.   Allergies  Review of patient's allergies indicates no known allergies.  Home Medications   Prior to Admission medications   Medication Sig Start Date End Date Taking? Authorizing Provider  Aspirin-Acetaminophen-Caffeine (GOODY HEADACHE PO) Take by mouth.   Yes Historical Provider, MD  albuterol (PROVENTIL HFA;VENTOLIN HFA) 108 (90  BASE) MCG/ACT inhaler Inhale 2 puffs into the lungs every 4 (four) hours as needed for wheezing. 04/15/13   Janne Napoleon, NP  temazepam (RESTORIL) 30 MG capsule Take one capsule tonight when you get home for sleep 02/22/15   Janne Napoleon, NP  tobramycin (TOBREX) 0.3 % ophthalmic solution Place 1 drop into the left eye every 4 (four) hours. X 5 days 02/22/15   Janne Napoleon, NP   BP 133/85 mmHg  Pulse 79  Temp(Src) 98.7 F (37.1 C) (Oral)  Resp 14  SpO2 97%  LMP 02/08/2015 Physical Exam  Constitutional: She is oriented to person, place, and time. She appears well-developed and well-nourished. No distress.  Eyes: EOM are normal. Pupils are equal, round, and reactive to light. Left eye exhibits discharge. No scleral icterus.  Left conjunctival erythema. Some clear watery drainage.  Neck: Normal range of motion. Neck supple.  Pulmonary/Chest: Effort normal. No respiratory distress.  Neurological: She is alert and oriented to person, place, and time.  Skin: Skin is warm and dry.  Nursing note and vitals reviewed.   ED Course  Procedures (including critical care time) Labs Review Labs Reviewed - No data to display  Procedure note: Tetracaine eyedrops #3 were placed into the left eye. A fluorescein strip was placed in the left eye. Eye was examined under magnified blue light. There is a crescent shaped  curvilinear abrasion over the cornea just below the pupil at the 6:00 position. The sclera is injected. Anterior Aqueous chamber is clear. The upper eyelid was everted and no foreign bodies were seen there, the lower lid or elsewhere. No other abrasions were seen. The eye was then irrigated with one bottle of eyewash. Imaging Review No results found.   MDM   1. Corneal abrasion, left, initial encounter    Administer norco 5 mg po now. In 2 hours take the Temazepam 30 mg capsule for a good night sleep.  May take Tylenol as needed for pain Tobrex drops 4 times a day Follow-up with your eye doctor  tomorrow as needed.   Janne Napoleon, NP 02/22/15 2130  Janne Napoleon, NP 02/22/15 2135

## 2015-11-14 ENCOUNTER — Encounter (HOSPITAL_COMMUNITY): Payer: Self-pay | Admitting: Emergency Medicine

## 2015-11-14 ENCOUNTER — Emergency Department (HOSPITAL_COMMUNITY): Payer: Medicaid Other

## 2015-11-14 ENCOUNTER — Emergency Department (HOSPITAL_COMMUNITY)
Admission: EM | Admit: 2015-11-14 | Discharge: 2015-11-14 | Disposition: A | Discharge status: Home / Self Care | Payer: Medicaid Other | Attending: Emergency Medicine | Admitting: Emergency Medicine

## 2015-11-14 DIAGNOSIS — Z3202 Encounter for pregnancy test, result negative: Secondary | ICD-10-CM | POA: Insufficient documentation

## 2015-11-14 DIAGNOSIS — J069 Acute upper respiratory infection, unspecified: Secondary | ICD-10-CM

## 2015-11-14 DIAGNOSIS — R000 Tachycardia, unspecified: Secondary | ICD-10-CM | POA: Insufficient documentation

## 2015-11-14 DIAGNOSIS — Z791 Long term (current) use of non-steroidal anti-inflammatories (NSAID): Secondary | ICD-10-CM | POA: Diagnosis not present

## 2015-11-14 DIAGNOSIS — Z7982 Long term (current) use of aspirin: Secondary | ICD-10-CM | POA: Diagnosis not present

## 2015-11-14 DIAGNOSIS — F419 Anxiety disorder, unspecified: Secondary | ICD-10-CM

## 2015-11-14 DIAGNOSIS — R0602 Shortness of breath: Secondary | ICD-10-CM | POA: Diagnosis present

## 2015-11-14 LAB — CBC WITH DIFFERENTIAL/PLATELET
Basophils Absolute: 0 10*3/uL (ref 0.0–0.1)
Basophils Relative: 0 %
Eosinophils Absolute: 0 10*3/uL (ref 0.0–0.7)
Eosinophils Relative: 0 %
HCT: 37 % (ref 36.0–46.0)
HEMOGLOBIN: 12.2 g/dL (ref 12.0–15.0)
LYMPHS ABS: 0.8 10*3/uL (ref 0.7–4.0)
LYMPHS PCT: 5 %
MCH: 26 pg (ref 26.0–34.0)
MCHC: 33 g/dL (ref 30.0–36.0)
MCV: 78.9 fL (ref 78.0–100.0)
Monocytes Absolute: 0.6 10*3/uL (ref 0.1–1.0)
Monocytes Relative: 4 %
NEUTROS PCT: 91 %
Neutro Abs: 12.7 10*3/uL — ABNORMAL HIGH (ref 1.7–7.7)
Platelets: 397 10*3/uL (ref 150–400)
RBC: 4.69 MIL/uL (ref 3.87–5.11)
RDW: 13.6 % (ref 11.5–15.5)
WBC: 14 10*3/uL — AB (ref 4.0–10.5)

## 2015-11-14 LAB — I-STAT BETA HCG BLOOD, ED (MC, WL, AP ONLY): I-stat hCG, quantitative: <5 m[IU]/mL (ref <5)

## 2015-11-14 LAB — BASIC METABOLIC PANEL
Anion gap: 10 (ref 5–15)
BUN: 11 mg/dL (ref 6–20)
CHLORIDE: 98 mmol/L — AB (ref 101–111)
CO2: 26 mmol/L (ref 22–32)
Calcium: 9.2 mg/dL (ref 8.9–10.3)
Creatinine, Ser: 0.68 mg/dL (ref 0.44–1.00)
GFR calc non Af Amer: >60 mL/min (ref >60)
Glucose, Bld: 129 mg/dL — ABNORMAL HIGH (ref 65–99)
Potassium: 3.5 mmol/L (ref 3.5–5.1)
SODIUM: 134 mmol/L — AB (ref 135–145)

## 2015-11-14 LAB — I-STAT TROPONIN, ED: Troponin i, poc: 0 ng/mL (ref 0.00–0.08)

## 2015-11-14 MED ORDER — ALBUTEROL SULFATE HFA 108 (90 BASE) MCG/ACT IN AERS: 1.0000 | INHALATION_SPRAY | RESPIRATORY_TRACT | Status: DC | PRN

## 2015-11-14 MED ORDER — BENZONATATE 100 MG PO CAPS: 100.0000 mg | ORAL_CAPSULE | Freq: Three times a day (TID) | ORAL | Status: DC

## 2015-11-14 MED ORDER — SODIUM CHLORIDE 0.9 % IV BOLUS (SEPSIS)
1000.0000 mL | Freq: Once | INTRAVENOUS | Status: AC
  Administered 2015-11-14: 1000 mL via INTRAVENOUS

## 2015-11-14 MED ORDER — ONDANSETRON 4 MG PO TBDP
4.0000 mg | ORAL_TABLET | Freq: Once | ORAL | Status: DC
  Filled 2015-11-14: qty 1

## 2015-11-14 MED ORDER — GUAIFENESIN ER 600 MG PO TB12: 600.0000 mg | ORAL_TABLET | Freq: Two times a day (BID) | ORAL | Status: DC

## 2015-11-14 NOTE — ED Notes (Signed)
Patient transported to X-ray 

## 2015-11-14 NOTE — Discharge Instructions (Signed)
Your chest x-ray and bloodwork were normal. I will give you a prescription for medicine to help with your cough. I will also give you a prescription for an inhaler to use for chest tightness/shortness of breath after coughing fits. Regarding your chest tightness and shortness, I think some of this is in large part due to your anxiety. Please see the list of clinics below to establish a new primary care provider for follow-up per our discussion. I have also attached the contact information for [facility]--please schedule an appointment with them regarding your memory concerns.   Take medications as prescribed. Return to the emergency room for worsening condition or new concerning symptoms. Follow up with your regular doctor. If you don't have a regular doctor use one of the numbers below to establish a primary care doctor.   Emergency Department Resource Guide 1) Find a Doctor and Pay Out of Pocket Although you won't have to find out who is covered by your insurance plan, it is a good idea to ask around and get recommendations. You will then need to call the office and see if the doctor you have chosen will accept you as a new patient and what types of options they offer for patients who are self-pay. Some doctors offer discounts or will set up payment plans for their patients who do not have insurance, but you will need to ask so you aren't surprised when you get to your appointment.  2) Contact Your Local Health Department Not all health departments have doctors that can see patients for sick visits, but many do, so it is worth a call to see if yours does. If you don't know where your local health department is, you can check in your phone book. The CDC also has a tool to help you locate your state's health department, and many state websites also have listings of all of their local health departments.  3) Find a [place] [facility] If your illness is not likely to be very severe or complicated,  you may want to try a walk in clinic. These are popping up all over the country in pharmacies, drugstores, and shopping centers. They're usually staffed by nurse practitioners or physician assistants that have been trained to treat common illnesses and complaints. They're usually fairly quick and inexpensive. However, if you have serious medical issues or chronic medical problems, these are probably not your best option.  No Primary Care Doctor: - Call Health Connect at  [phone] - they can help you locate a primary care doctor that  accepts your insurance, provides certain services, etc. - Physician Referral [phone]  Emergency Department Resource Guide 1) Find a Doctor and Pay Out of Pocket Although you won't have to find out who is covered by your insurance plan, it is a good idea to ask around and get recommendations. You will then need to call the office and see if the doctor you have chosen will accept you as a new patient and what types of options they offer for patients who are self-pay. Some doctors offer discounts or will set up payment plans for their patients who do not have insurance, but you will need to ask so you aren't surprised when you get to your appointment.  2) Contact Your Local Health Department Not all health departments have doctors that can see patients for sick visits, but many do, so it is worth a call to see if yours does. If you don't know where your local health department  is, you can check in your phone book. The CDC also has a tool to help you locate your state's health department, and many state websites also have listings of all of their local health departments.  3) Find a [facility] If your illness is not likely to be very severe or complicated, you may want to try a walk in clinic. These are popping up all over the country in pharmacies, drugstores, and shopping centers. They're usually staffed by nurse practitioners or physician assistants that  have been trained to treat common illnesses and complaints. They're usually fairly quick and inexpensive. However, if you have serious medical issues or chronic medical problems, these are probably not your best option.  No Primary Care Doctor: - Call Health Connect at  [phone] - they can help you locate a primary care doctor that  accepts your insurance, provides certain services, etc. - Physician Referral Service- [phone]  Chronic Pain Problems: Organization         Address  Phone   Notes  [facility]  [phone] Patients need to be referred by their primary care doctor.   Medication Assistance: Organization         Address  Phone   Notes  [facility] [place], [place] [address] [phone] --Must be a resident of [facility] -- Must have NO insurance coverage whatsoever (no Medicaid/ Medicare, etc.) -- The pt. MUST have a primary care doctor that directs their care regularly and follows them in the community   [facility]  [phone]   [employer]  [phone]    Agencies that provide inexpensive medical care: Organization         Address  Phone   Notes  [place]  [phone]   [facility]    [phone]   [facility] [place], [place] [address] [phone]   [place] [address] [place] [phone]   [facility]    [phone]   [facility]    [phone]   [place] and [place] [address], [place] Phone:  [phone], Fax:  [phone] Hours of Operation:  9 am - 6 pm, M-F.  Also accepts Medicaid/Medicare and self-pay.  [facility] for [place], [address], [place] Phone: [phone], Fax: [phone]. Hours of Operation:  8:30 am - 5:30 pm, M-F.  Also accepts Medicaid and  self-pay.  [facility] [address], [place] Phone: [phone]   [place], [place], [place] [phone], Ext. [phone] Mondays & Thursdays: 7-9 AM.  First 15 patients are seen on a first come, first serve basis.    [place] Providers:  Organization         Address  Phone   Notes  [facility] [address], [place] [phone] Also accepts self-pay patients.  [place], [place]  [phone]   [place], [address], [place] [phone]   [facility] [address], [place] [phone]   Lucianne Lei [address], [place]   [phone] Only accepts [place] patients after they have their  name applied to their card.   Self-Pay (no insurance) in [facility]:  Organization         Address  Phone   Notes  Sickle Cell Patients, [facility] [place] [phone]   [facility] [place] [phone]   [facility]  [place], [place], [place] [phone]   [facility]/Dr. Osei-Bonsu  [address] [place] [address], [place] [phone] Phone number for both [place] and [place] locations is the same.  [facility] and [facility] [address] [place] [phone]   [facility] [address], [place] or [address] [phone]   [facility] [address] [place] [phone], phone; [phone], fax Sees patients 1st and 3rd Saturday of every month.  Must not qualify for public or private insurance (i.e. Medicaid, Medicare, [place] Health Choice, Veterans' Benefits)  Household income  should be no more than 200% of the poverty level The clinic cannot treat you if you are pregnant or think you are pregnant  Sexually transmitted diseases are not treated at the clinic.

## 2015-11-14 NOTE — ED Provider Notes (Signed)
CSN: [id number]     Arrival date & time 11/14/15  1603 History   First MD Initiated Contact with Patient 11/14/15 1616     Chief Complaint  Patient presents with  . Tachycardia  . Generalized Body Aches  . Shortness of Breath    HPI  Ms. Angela Romero is an 39 y.o. female with history of anxiety who presents to the ED for evaluation of body aches and SOB. She states that she was in her usual state of health until last night/this morning when she began having a nonproductive cough associated with shortness of breath. States that her muscles ache all over. Denies chest pain but states that her chest feels tight. She denies headache, fever, chills, N/V/D. Denies dysuria, urinary frequency, or urgency   She does endorse a history of anxiety as well and states she has talked to her PCP about it but she does not like her PCP and would like to change providers. Pt states she is mostly anxious because she feels like she is becoming more and more forgetful. She denies confusion but states she will sometimes forget to turn the stove off, etc. She states she has a lot of anxiety because she is worried there is something wrong with her brain. Denies fam hx of Alzheimer's. Denies any weakness, dizziness. She states she used to take ativan for her anxiety but stopped because she feels like it makes her memory worse.  Past Medical History  Diagnosis Date  . Medical history non-contributory   . Memory difficulty    Past Surgical History  Procedure Laterality Date  . Inner ear surgery  1996     x 4 other 3 as a child  . Eye surgery    . Refractive surgery  07/2014   Family History  Problem Relation Age of Onset  . Diabetes Mellitus II Mother   . Diabetes Mother   . Cancer Father     prostate   Social History  Substance Use Topics  . Smoking status: Never Smoker   . Smokeless tobacco: Never Used  . Alcohol Use: No   OB History    Gravida Para Term Preterm AB TAB SAB Ectopic Multiple Living   0  0 0 0 0 0 0 0       Review of Systems  All other systems reviewed and are negative.     Allergies  Review of patient's allergies indicates no known allergies.  Home Medications   Prior to Admission medications   Medication Sig Start Date End Date Taking? Authorizing Provider  aspirin 325 MG EC tablet Take 325 mg by mouth every 6 (six) hours as needed for pain.   Yes Historical Provider, MD  LORazepam (ATIVAN) 0.5 MG tablet TAKE (1) TABLET EVERY EIGHT TO TWELVE HOURS AS NEEDED 08/09/15  Yes Historical Provider, MD  naproxen (NAPROSYN) 500 MG tablet TAKE (1) TABLET BY MOUTH TWICE A DAY 08/09/15  Yes Historical Provider, MD  albuterol (PROVENTIL HFA;VENTOLIN HFA) 108 (90 BASE) MCG/ACT inhaler Inhale 2 puffs into the lungs every 4 (four) hours as needed for wheezing. Patient not taking: Reported on 11/14/2015 04/15/13   Janne Napoleon, NP  temazepam (RESTORIL) 30 MG capsule Take one capsule tonight when you get home for sleep Patient not taking: Reported on 11/14/2015 02/22/15   Janne Napoleon, NP  tobramycin (TOBREX) 0.3 % ophthalmic solution Place 1 drop into the left eye every 4 (four) hours. X 5 days Patient not taking: Reported on 11/14/2015 02/22/15  Janne Napoleon, NP   BP 125/69 mmHg  Pulse 123  Temp(Src) 99.4 F (37.4 C) (Oral)  Resp 22  SpO2 97%  LMP 11/07/2015 Physical Exam  Constitutional: She is oriented to person, place, and time.  Appears anxious  HENT:  Right Ear: External ear normal.  Left Ear: External ear normal.  Nose: Nose normal.  Mouth/Throat: Oropharynx is clear and moist. No oropharyngeal exudate.  Eyes: Conjunctivae and EOM are normal. Pupils are equal, round, and reactive to light.  Neck: Normal range of motion. Neck supple.  Cardiovascular: Regular rhythm, normal heart sounds and intact distal pulses.  Tachycardia present.   No murmur heard. Pulmonary/Chest: Effort normal and breath sounds normal. No respiratory distress. She has no wheezes.  Abdominal: Soft. Bowel  sounds are normal. She exhibits no distension. There is no tenderness. There is no rebound and no guarding.  Musculoskeletal: Normal range of motion. She exhibits no edema or tenderness.  Lymphadenopathy:    She has no cervical adenopathy.  Neurological: She is alert and oriented to person, place, and time. She has normal strength. No cranial nerve deficit. She displays a negative Romberg sign. Coordination normal.  Skin: Skin is warm and dry.  Psychiatric: She has a normal mood and affect.  Nursing note and vitals reviewed.   ED Course  Procedures (including critical care time) Labs Review Labs Reviewed  BASIC METABOLIC PANEL - Abnormal; Notable for the following:    Sodium 134 (*)    Chloride 98 (*)    Glucose, Bld 129 (*)    All other components within normal limits  CBC WITH DIFFERENTIAL/PLATELET - Abnormal; Notable for the following:    WBC 14.0 (*)    Neutro Abs 12.7 (*)    All other components within normal limits  I-STAT TROPOININ, ED  I-STAT BETA HCG BLOOD, ED ([facility], [facility], [facility] ONLY)    Imaging Review Dg Chest 2 View  11/14/2015  CLINICAL DATA:  Chest pain with tachycardia; shortness of breath. Nausea and vomiting EXAM: CHEST  2 VIEW COMPARISON:  February 06, 2015 FINDINGS: There is no edema or consolidation. The heart size and pulmonary vascularity are normal. No pneumothorax. No adenopathy. No bone lesions. IMPRESSION: No edema or consolidation. Electronically Signed   By: Lowella Grip III M.D.   On: 11/14/2015 16:57   I have personally reviewed and evaluated these images and lab results as part of my medical decision-making.   EKG Interpretation   Date/Time:  Wednesday November 14 2015 16:19:26 EST Ventricular Rate:  126 PR Interval:  143 QRS Duration: 89 QT Interval:  443 QTC Calculation: 641 R Axis:   57 Text Interpretation:  Sinus tachycardia Prolonged QT interval When  compared with ECG of 09/25/2014, QT has lengthened Confirmed by [facility]  MD,  DAVID ([id number])  on 11/14/2015 4:31:06 PM      MDM   Final diagnoses:  Upper respiratory infection  Anxiety    I suspect viral URI. White count slightly up at 14. BMP unremarkable. CXR shows no evidence of acute pathology. Pt initially tachycardic to 120s. 1L NS bolus given. HR down to 80s. EKG shows sinus tach, slightly prolonged QTc, but otherwise unremarkable. I stat troponin negative. I suspect pt's tachycardia and chest tightness are due to anxiety exacerbated by URI. Regarding memory and anxiety, pt's neuro exam is completely intact today. I will give resource guide and referral to neurology given pt's memory concerns and desire to establish new pcp. Will give supportive meds for home. Return precautions  given.         Anne Ng, PA-C 11/16/15 [id number]  Sharlett Iles, MD 11/17/15 [phone]

## 2015-11-14 NOTE — ED Notes (Signed)
Pt has been having generalized body aches, nausea, vomiting since last night. Tachycardic at 130, mild chest pain and complaints of SOB.

## 2015-11-14 NOTE — ED Notes (Signed)
Patient was alert, oriented and stable upon discharge. RN went over AVS and patient had no further questions.  

## 2015-11-14 NOTE — ED Notes (Signed)
Nurse drawing labs. 

## 2015-11-14 NOTE — Progress Notes (Addendum)
Updated pcp as listed per medicaid [place] access response hx  [facility] Entered update in d/c section to include  Lucianne Lei Schedule an appointment as soon as possible for a visit on 11/16/2015 This is your assigned Medicaid [place] access doctor If you prefer another contact [place] is retired [place] [id number] [phone] Medicaid [place] Patient Angela Romero Medicaid Transportation assists you to your dr appointments: [phone] or [phone] Transportation Supervisor [phone] assigned your doctor *You may receive a bill if you go to any family Dr not assigned to you As a Medicaid client you MUST contact DSS/SSI each time you change address, move to another county or another state to keep your address updated [place]: [place]: [phone] (main) [email or web address] [address] [place], [place] [address]

## 2016-02-22 ENCOUNTER — Ambulatory Visit
Admission: RE | Admit: 2016-02-22 | Discharge: 2016-02-22 | Disposition: A | Discharge status: Home / Self Care | Payer: Medicaid Other | Source: Ambulatory Visit | Attending: Family Medicine | Admitting: Family Medicine

## 2016-02-22 ENCOUNTER — Other Ambulatory Visit: Payer: Self-pay | Admitting: Family Medicine

## 2016-02-22 DIAGNOSIS — W010XXA Fall on same level from slipping, tripping and stumbling without subsequent striking against object, initial encounter: Secondary | ICD-10-CM

## 2016-07-11 ENCOUNTER — Encounter (HOSPITAL_COMMUNITY): Payer: Self-pay | Admitting: *Deleted

## 2016-07-11 ENCOUNTER — Emergency Department (HOSPITAL_COMMUNITY)
Admission: EM | Admit: 2016-07-11 | Discharge: 2016-07-11 | Disposition: A | Discharge status: Home / Self Care | Payer: No Typology Code available for payment source | Attending: Emergency Medicine | Admitting: Emergency Medicine

## 2016-07-11 DIAGNOSIS — Z79899 Other long term (current) drug therapy: Secondary | ICD-10-CM | POA: Insufficient documentation

## 2016-07-11 DIAGNOSIS — Y999 Unspecified external cause status: Secondary | ICD-10-CM | POA: Diagnosis not present

## 2016-07-11 DIAGNOSIS — M545 Low back pain, unspecified: Secondary | ICD-10-CM

## 2016-07-11 DIAGNOSIS — Z7982 Long term (current) use of aspirin: Secondary | ICD-10-CM | POA: Insufficient documentation

## 2016-07-11 DIAGNOSIS — S161XXA Strain of muscle, fascia and tendon at neck level, initial encounter: Secondary | ICD-10-CM | POA: Diagnosis not present

## 2016-07-11 DIAGNOSIS — S199XXA Unspecified injury of neck, initial encounter: Secondary | ICD-10-CM | POA: Diagnosis present

## 2016-07-11 DIAGNOSIS — Y92410 Unspecified street and highway as the place of occurrence of the external cause: Secondary | ICD-10-CM | POA: Diagnosis not present

## 2016-07-11 DIAGNOSIS — Y939 Activity, unspecified: Secondary | ICD-10-CM | POA: Diagnosis not present

## 2016-07-11 MED ORDER — METHOCARBAMOL 500 MG PO TABS: 500.0000 mg | ORAL_TABLET | Freq: Two times a day (BID) | ORAL | Status: DC

## 2016-07-11 MED ORDER — METHOCARBAMOL 500 MG PO TABS
500.0000 mg | ORAL_TABLET | Freq: Once | ORAL | Status: AC
  Administered 2016-07-11: 500 mg via ORAL
  Filled 2016-07-11: qty 1

## 2016-07-11 MED ORDER — IBUPROFEN 400 MG PO TABS
600.0000 mg | ORAL_TABLET | Freq: Once | ORAL | Status: AC
  Administered 2016-07-11: 600 mg via ORAL
  Filled 2016-07-11: qty 1

## 2016-07-11 MED ORDER — NAPROXEN 500 MG PO TABS: 500.0000 mg | ORAL_TABLET | Freq: Two times a day (BID) | ORAL | Status: DC
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
## 2016-07-11 NOTE — Discharge Instructions (Signed)

## 2017-02-09 DIAGNOSIS — F331 Major depressive disorder, recurrent, moderate: Secondary | ICD-10-CM | POA: Insufficient documentation

## 2017-05-20 ENCOUNTER — Encounter: Payer: Self-pay | Admitting: Gastroenterology

## 2017-06-30 ENCOUNTER — Encounter: Payer: Self-pay | Admitting: Gastroenterology

## 2017-06-30 ENCOUNTER — Other Ambulatory Visit (INDEPENDENT_AMBULATORY_CARE_PROVIDER_SITE_OTHER): Payer: Medicaid Other

## 2017-06-30 ENCOUNTER — Ambulatory Visit (INDEPENDENT_AMBULATORY_CARE_PROVIDER_SITE_OTHER): Payer: Medicaid Other | Admitting: Gastroenterology

## 2017-06-30 VITALS — BP 124/78 | HR 88 | Ht 61.5 in | Wt 144.2 lb

## 2017-06-30 DIAGNOSIS — K529 Noninfective gastroenteritis and colitis, unspecified: Secondary | ICD-10-CM

## 2017-06-30 DIAGNOSIS — R109 Unspecified abdominal pain: Secondary | ICD-10-CM

## 2017-06-30 LAB — IGA: IgA: 195 mg/dL (ref 68–378)

## 2017-06-30 LAB — CBC WITH DIFFERENTIAL/PLATELET
BASOS PCT: 0.7 % (ref 0.0–3.0)
Basophils Absolute: 0.1 10*3/uL (ref 0.0–0.1)
EOS ABS: 0 10*3/uL (ref 0.0–0.7)
EOS PCT: 0.4 % (ref 0.0–5.0)
HCT: 28.9 % — ABNORMAL LOW (ref 36.0–46.0)
HEMOGLOBIN: 9 g/dL — AB (ref 12.0–15.0)
LYMPHS ABS: 2.3 10*3/uL (ref 0.7–4.0)
Lymphocytes Relative: 26.1 % (ref 12.0–46.0)
MCHC: 31 g/dL (ref 30.0–36.0)
MCV: 68.3 fl — ABNORMAL LOW (ref 78.0–100.0)
Monocytes Absolute: 0.5 10*3/uL (ref 0.1–1.0)
Monocytes Relative: 5.5 % (ref 3.0–12.0)
NEUTROS ABS: 5.8 10*3/uL (ref 1.4–7.7)
NEUTROS PCT: 67.3 % (ref 43.0–77.0)
PLATELETS: 483 10*3/uL — AB (ref 150.0–400.0)
RBC: 4.24 Mil/uL (ref 3.87–5.11)
RDW: 17 % — ABNORMAL HIGH (ref 11.5–15.5)
WBC: 8.7 10*3/uL (ref 4.0–10.5)

## 2017-06-30 NOTE — Progress Notes (Signed)
HPI :  41 y/o female with a history of depression, hearing loss, here for a new patient evaluation for chronic diarrhea and abdominal pains.   She reports diarrhea / abdominal pain has been bothering her for years and is a chronic issues. Her stool frequency can vary, usually depends on diet. No blood in the stools. Stools are usually loose and watery. She has intermittent lower abdominal discomfort, usually associated with the urge to have a bowel movement. Milk bothers her specifically and frequently precipitates symptoms. She is not sure if having bowel movement will relieve her pain most of the time. She thinks her weight is stable, may be gaining weight. No fevers or nightsweats.  No FH of colon cancer. No known FH of Crohns or colitis. No FH of celiac disease.   She takes buspar periodically, and prozac 40mg  q HS longstanding. No new medications. She takes rare NSAIDs for knee pains.  No prior colonoscopy.     Past Medical History:  Diagnosis Date  . Depression   . Medical history non-contributory   . Memory difficulty   . Problems with hearing      Past Surgical History:  Procedure Laterality Date  . EYE SURGERY    . [place]    x 4 other 3 as a child  . REFRACTIVE SURGERY  07/2014   Family History  Problem Relation Age of Onset  . Diabetes Mellitus II Mother   . Diabetes Mother   . Prostate cancer Father    Social History  Substance Use Topics  . Smoking status: Never Smoker  . Smokeless tobacco: Never Used  . Alcohol use No   Current Outpatient Prescriptions  Medication Sig Dispense Refill  . busPIRone (BUSPAR) 15 MG tablet Take 1 tablet by mouth at bedtime as needed.    Marland Kitchen CALCIUM CARB-CHOLECALCIFEROL PO Take 1 tablet by mouth as needed.    Marland Kitchen FLUoxetine (PROZAC) 40 MG capsule Take 1 capsule by mouth daily.    Marland Kitchen glucosamine-chondroitin 500-400 MG tablet Take 1 tablet by mouth as needed.     No current facility-administered medications for this  visit.    No Known Allergies   Review of Systems: All systems reviewed and negative except where noted in HPI.    Physical Exam: BP 124/78 (BP Location: Left Arm, Patient Position: Sitting, Cuff Size: Normal)   Pulse 88   Ht 5' 1.5" (1.562 m) Comment: height measured without shoes  Wt 144 lb 4 oz (65.4 kg)   BMI 26.81 kg/m  Constitutional: Pleasant,well-developed, female in no acute distress. HEENT: Normocephalic and atraumatic. No scleral icterus. Neck supple.  Cardiovascular: Normal rate, regular rhythm.  Pulmonary/chest: Effort normal and breath sounds normal. No wheezing, rales or rhonchi. Abdominal: Soft, nondistended, nontender. . There are no masses palpable. No hepatomegaly. Extremities: no edema Lymphadenopathy: No cervical adenopathy noted. Neurological: Alert and oriented to person place and time. Skin: Skin is warm and dry. No rashes noted. Psychiatric: Normal mood and affect. Behavior is normal.   ASSESSMENT AND PLAN: 41 y/o female with a history of depression on Prozac, presenting with years of chronic loose stools and intermittent abdominal pains. Given the chronicity and stability of these symptoms over years, IBS would seem quite possible, and we discussed this for a bit; however, recommend baseline labs to include a CBC given she has not had one in a few years, and labs to assess for celiac disease. I also recommend fecal lactoferrin to ensure negative. While  awaiting lab workup, discussed a trial of low FODMAP diet and she was interested to try this given some of her food intolerances. She can otherwise use immodium as needed for her symptoms to slow diarrhea. I offered her a trial of Bentyl for her pain which she declined. If she has an anemia or positive labs sent as above, we will consider colonoscopy. If labs are all normal, will await her course with regimen as above.  She agreed with the plan, further recommendations pending the results of her workup.    [place] Cellar, MD [facility] Pager [phone]  CC: Barrie Lyme, FNP

## 2017-06-30 NOTE — Patient Instructions (Signed)
If you are age 38 or older, your body mass index should be between 23-30. Your Body mass index is 26.81 kg/m. If this is out of the aforementioned range listed, please consider follow up with your Primary Care Provider.  If you are age 57 or younger, your body mass index should be between 19-25. Your Body mass index is 26.81 kg/m. If this is out of the aformentioned range listed, please consider follow up with your Primary Care Provider.   Your physician has requested that you go to the basement for the following lab work before leaving today:  Celiac labs, CBC, Stool test  Please use Immodium as needed.  Call Dr. Havery Moros back if symptoms persist.  Otherwise, follow up as needed.  Thank you.

## 2017-07-01 ENCOUNTER — Other Ambulatory Visit: Payer: Medicaid Other

## 2017-07-01 DIAGNOSIS — K529 Noninfective gastroenteritis and colitis, unspecified: Secondary | ICD-10-CM

## 2017-07-01 DIAGNOSIS — R109 Unspecified abdominal pain: Secondary | ICD-10-CM

## 2017-07-01 LAB — TISSUE TRANSGLUTAMINASE, IGA: Tissue Transglutaminase Ab, IgA: 1 U/mL (ref <4)

## 2017-07-02 LAB — FECAL LACTOFERRIN, QUANT: Lactoferrin: NEGATIVE

## 2017-07-08 ENCOUNTER — Telehealth: Payer: Self-pay

## 2017-07-08 ENCOUNTER — Other Ambulatory Visit: Payer: Self-pay

## 2017-07-08 DIAGNOSIS — D509 Iron deficiency anemia, unspecified: Secondary | ICD-10-CM

## 2017-07-08 NOTE — Telephone Encounter (Signed)
-----   Message from Manus Gunning, MD sent at 07/02/2017  1:21 PM EDT ----- Can you please let this patient know that her celiac labs are negative making celiac very unlikely However she has a microcytic anemia on labs which is new. This is concerning for iron deficiency.  Would you ask her to go to the lab for TIBC and ferritin draw - if iron deficient, given her symptoms I think EGD and colonoscopy will be needed. Not sure if she has heavy menstrual cycles otherwise which could cause this, if you don't mind asking her about that. We will get back to her with iron studies. Thanks

## 2017-08-04 ENCOUNTER — Telehealth: Payer: Self-pay | Admitting: Gastroenterology

## 2017-08-04 NOTE — Telephone Encounter (Signed)
Thanks Almyra Free, Yes I think an EGD and colonoscopy is warranted given her persistent symptoms and anemia. Can you help coordinate if she is willing to proceed? Thanks

## 2017-08-04 NOTE — Telephone Encounter (Signed)
Patient called back for lab results, let her know and that you would like to do further iron studies. Corrected patient phone number in our system. Patient still has watery diarrhea, associated with abdominal pain, do you want to do other studies? Thanks.

## 2017-08-05 NOTE — Telephone Encounter (Signed)
Left message for patient to call back. Looking at schedule endocolon next available date is not until October.

## 2017-08-06 NOTE — Telephone Encounter (Signed)
Patient called back and is now scheduled for endocolon.

## 2017-09-11 ENCOUNTER — Telehealth: Payer: Self-pay | Admitting: *Deleted

## 2017-09-11 NOTE — Telephone Encounter (Signed)
Pt no show for PV today. Called and lm to call before 5 pm today if patient desires to keep ECL scheduled for 09/23/17. If pt does not call by 5 pm, ECL will be cancelled and both appointments will need to be rescheduled.

## 2017-09-11 NOTE — Telephone Encounter (Signed)
Reached patient to reschedule PV. Pt advised of dietary restrictions since her PV appt falls within 5 days of her procedures. Also advised if she misses this PV appt, then procedure will be cancelled. Understands information.

## 2017-09-14 ENCOUNTER — Encounter: Payer: Self-pay | Admitting: Gastroenterology

## 2017-09-21 ENCOUNTER — Ambulatory Visit (AMBULATORY_SURGERY_CENTER): Payer: Self-pay | Admitting: *Deleted

## 2017-09-21 VITALS — Ht 62.0 in | Wt 137.0 lb

## 2017-09-21 DIAGNOSIS — D509 Iron deficiency anemia, unspecified: Secondary | ICD-10-CM

## 2017-09-21 DIAGNOSIS — K529 Noninfective gastroenteritis and colitis, unspecified: Secondary | ICD-10-CM

## 2017-09-21 MED ORDER — NA SULFATE-K SULFATE-MG SULF 17.5-3.13-1.6 GM/177ML PO SOLN: ORAL | 0 refills | Status: DC

## 2017-09-21 NOTE — Progress Notes (Signed)
Patient was 40 minutes late to [place] today. husband at pt's side during pv helping to answer questions. Patient denies any allergies to eggs or soy. Patient denies any problems with anesthesia/sedation. Patient denies any oxygen use at home. Patient denies taking any diet/weight loss medications or blood thinners.

## 2017-09-23 ENCOUNTER — Ambulatory Visit (AMBULATORY_SURGERY_CENTER): Payer: Medicaid Other | Admitting: Gastroenterology

## 2017-09-23 ENCOUNTER — Encounter: Payer: Medicaid Other | Admitting: Gastroenterology

## 2017-09-23 ENCOUNTER — Encounter: Payer: Self-pay | Admitting: Gastroenterology

## 2017-09-23 ENCOUNTER — Other Ambulatory Visit (HOSPITAL_COMMUNITY)
Admission: RE | Admit: 2017-09-23 | Discharge: 2017-09-23 | Disposition: A | Discharge status: Home / Self Care | Payer: Medicaid Other | Source: Ambulatory Visit | Attending: Gastroenterology | Admitting: Gastroenterology

## 2017-09-23 VITALS — BP 118/62 | HR 59 | Temp 98.4°F | Resp 17 | Ht 62.0 in | Wt 137.0 lb

## 2017-09-23 DIAGNOSIS — D127 Benign neoplasm of rectosigmoid junction: Secondary | ICD-10-CM

## 2017-09-23 DIAGNOSIS — D509 Iron deficiency anemia, unspecified: Secondary | ICD-10-CM | POA: Insufficient documentation

## 2017-09-23 DIAGNOSIS — K229 Disease of esophagus, unspecified: Secondary | ICD-10-CM

## 2017-09-23 DIAGNOSIS — K529 Noninfective gastroenteritis and colitis, unspecified: Secondary | ICD-10-CM

## 2017-09-23 DIAGNOSIS — K317 Polyp of stomach and duodenum: Secondary | ICD-10-CM | POA: Insufficient documentation

## 2017-09-23 DIAGNOSIS — K2960 Other gastritis without bleeding: Secondary | ICD-10-CM | POA: Insufficient documentation

## 2017-09-23 MED ORDER — SODIUM CHLORIDE 0.9 % IV SOLN: 500.0000 mL | INTRAVENOUS | Status: DC

## 2017-09-23 NOTE — Op Note (Signed)
[facility] Patient Name: Angela Romero Procedure Date: 09/23/2017 3:10 PM MRN: [id number] Endoscopist: Remo Lipps P. Analayah Brooke MD, MD Age: 41 Referring MD:  Date of Birth: [birth year] Gender: Female Account #: [id number] Procedure:                Upper GI endoscopy Indications:              Iron deficiency anemia, chronic diarrhea Medicines:                Monitored Anesthesia Care Procedure:                Pre-Anesthesia Assessment:                           - Prior to the procedure, a History and Physical                            was performed, and patient medications and                            allergies were reviewed. The patient's tolerance of                            previous anesthesia was also reviewed. The risks                            and benefits of the procedure and the sedation                            options and risks were discussed with the patient.                            All questions were answered, and informed consent                            was obtained. Prior Anticoagulants: The patient has                            taken no previous anticoagulant or antiplatelet                            agents. ASA Grade Assessment: I - A normal, healthy                            patient. After reviewing the risks and benefits,                            the patient was deemed in satisfactory condition to                            undergo the procedure.                           After obtaining informed consent, the endoscope was  passed under direct vision. Throughout the                            procedure, the patient's blood pressure, pulse, and                            oxygen saturations were monitored continuously. The                            Endoscope was introduced through the mouth, and                            advanced to the second part of duodenum. The upper                            GI endoscopy  was accomplished without difficulty.                            The patient tolerated the procedure well. Scope In: Scope Out: Findings:                 Esophagogastric landmarks were identified: the                            Z-line was found at 37 cm, the gastroesophageal                            junction was found at 37 cm and the upper extent of                            the gastric folds was found at 37 cm from the                            incisors.                           A few small white plaques were found in the middle                            third of the esophagus. Brushings were obtained in                            the middle third of the esophagus to assess for                            candidiasis.                           The exam of the esophagus was otherwise normal.                           The entire examined stomach was normal. Biopsies  were taken from the antrum, body, and incisura with                            a cold forceps biopsy for H pylori testing.                           The duodenal bulb and second portion of the                            duodenum were normal. Biopsies for histology were                            taken with a cold forceps for evaluation of celiac                            disease. Complications:            No immediate complications. Estimated blood loss:                            Minimal. Estimated Blood Loss:     Estimated blood loss was minimal. Impression:               - Esophagogastric landmarks identified.                           - White plaques in the middle third of the                            esophagus. Brushings performed to rule out                            candidiasis.                           - Normal stomach. Biopsies taken to rule out H                            pylori given iron deficiency anemia.                           - Normal duodenal bulb and second portion of  the                            duodenum. Biopsied to rule out celiac disease.                           Overall, no obvious cause for iron deficiency on                            EGD and colonoscopy. We will await biopsy results.                            Anemia could otherwise be related to heavy  menstrual cycles. Recommendation:           - Patient has a contact number available for                            emergencies. The signs and symptoms of potential                            delayed complications were discussed with the                            patient. Return to normal activities tomorrow.                            Written discharge instructions were provided to the                            patient.                           - Resume previous diet.                           - Continue present medications.                           - Start oral iron (ferrous sulfate) if you have not                            already started this                           - Immodium as needed for diarrhea                           - Await pathology results with further                            recommendations Remo Lipps P. Elmarie Devlin MD, MD 09/23/2017 4:00:54 PM This report has been signed electronically.

## 2017-09-23 NOTE — Progress Notes (Signed)
Called to room to assist during endoscopic procedure.  Patient ID and intended procedure confirmed with present staff. Received instructions for my participation in the procedure from the performing physician.  

## 2017-09-23 NOTE — Op Note (Signed)
[facility] Patient Name: Angela Romero Procedure Date: 09/23/2017 3:10 PM MRN: [id number] Endoscopist: Remo Lipps P. Angela Covington MD, MD Age: 41 Referring MD:  Date of Birth: 1976-10-07 Gender: Female Account #: [id number] Procedure:                Colonoscopy Indications:              Chronic diarrhea, Iron deficiency anemia Medicines:                Monitored Anesthesia Care Procedure:                Pre-Anesthesia Assessment:                           - Prior to the procedure, a History and Physical                            was performed, and patient medications and                            allergies were reviewed. The patient's tolerance of                            previous anesthesia was also reviewed. The risks                            and benefits of the procedure and the sedation                            options and risks were discussed with the patient.                            All questions were answered, and informed consent                            was obtained. Prior Anticoagulants: The patient has                            taken no previous anticoagulant or antiplatelet                            agents. ASA Grade Assessment: I - A normal, healthy                            patient. After reviewing the risks and benefits,                            the patient was deemed in satisfactory condition to                            undergo the procedure.                           After obtaining informed consent, the colonoscope  was passed under direct vision. Throughout the                            procedure, the patient's blood pressure, pulse, and                            oxygen saturations were monitored continuously. The                            Model PCF-H190DL [id number]) scope was introduced                            through the anus and advanced to the the terminal                            ileum, with  identification of the appendiceal                            orifice and IC valve. The colonoscopy was performed                            without difficulty. The patient tolerated the                            procedure well. The quality of the bowel                            preparation was adequate. The terminal ileum,                            ileocecal valve, appendiceal orifice, and rectum                            were photographed. Scope In: 3:30:30 PM Scope Out: 3:47:16 PM Scope Withdrawal Time: 0 hours 14 minutes 22 seconds  Total Procedure Duration: 0 hours 16 minutes 46 seconds  Findings:                 The perianal and digital rectal examinations were                            normal.                           The terminal ileum appeared normal.                           A 5 mm polyp was found in the recto-sigmoid colon.                            The polyp was sessile. The polyp was removed with a                            cold snare. Resection and retrieval were complete.  Internal hemorrhoids were found during retroflexion.                           The bowel prep was only fair upon insertion of the                            colonoscope. Time was spent lavaging the colon to                            achieve adequate views of the colon, but the prep                            was adequate following lavage. The exam was                            otherwise without abnormality. No inflammatory                            changes noted.                           Biopsies for histology were taken with a cold                            forceps from the right colon, left colon and                            transverse colon for evaluation of microscopic                            colitis. Complications:            No immediate complications. Estimated blood loss:                            Minimal. Estimated Blood Loss:     Estimated blood loss  was minimal. Impression:               - The examined portion of the ileum was normal.                           - One 5 mm polyp at the recto-sigmoid colon,                            removed with a cold snare. Resected and retrieved.                           - Internal hemorrhoids.                           - The examination was otherwise normal.                           - Biopsies were taken with a cold forceps from the  right colon, left colon and transverse colon for                            evaluation of microscopic colitis.                           Overall, no cause for iron deficiency anemia noted                            on this exam. Recommendation:           - Patient has a contact number available for                            emergencies. The signs and symptoms of potential                            delayed complications were discussed with the                            patient. Return to normal activities tomorrow.                            Written discharge instructions were provided to the                            patient.                           - Resume previous diet.                           - Continue present medications.                           - Await pathology results. Remo Lipps P. Angela Devivo MD, MD 09/23/2017 3:56:07 PM This report has been signed electronically.

## 2017-09-23 NOTE — Patient Instructions (Addendum)
YOU HAD AN ENDOSCOPIC PROCEDURE TODAY AT [facility]:   Refer to the procedure report that was given to you for any specific questions about what was found during the examination.  If the procedure report does not answer your questions, please call your gastroenterologist to clarify.  If you requested that your care partner not be given the details of your procedure findings, then the procedure report has been included in a sealed envelope for you to review at your convenience later.  YOU SHOULD EXPECT: Some feelings of bloating in the abdomen. Passage of more gas than usual.  Walking can help get rid of the air that was put into your GI tract during the procedure and reduce the bloating. If you had a lower endoscopy (such as a colonoscopy or flexible sigmoidoscopy) you may notice spotting of blood in your stool or on the toilet paper. If you underwent a bowel prep for your procedure, you may not have a normal bowel movement for a few days.  Please Note:  You might notice some irritation and congestion in your nose or some drainage.  This is from the oxygen used during your procedure.  There is no need for concern and it should clear up in a day or so.  SYMPTOMS TO REPORT IMMEDIATELY:   Following lower endoscopy (colonoscopy or flexible sigmoidoscopy):  Excessive amounts of blood in the stool  Significant tenderness or worsening of abdominal pains  Swelling of the abdomen that is new, acute  Fever of 100F or higher   Following upper endoscopy (EGD)  Vomiting of blood or coffee ground material  New chest pain or pain under the shoulder blades  Painful or persistently difficult swallowing  New shortness of breath  Fever of 100F or higher  Black, tarry-looking stools  For urgent or emergent issues, a gastroenterologist can be reached at any hour by calling [phone].  Please start Iron tablets 2 day that you have at home. Please read all handouts given to you by your  recovery nurse.   DIET:  We do recommend a small meal at first, but then you may proceed to your regular diet.  Drink plenty of fluids but you should avoid alcoholic beverages for 24 hours.  ACTIVITY:  You should plan to take it easy for the rest of today and you should NOT DRIVE or use heavy machinery until tomorrow (because of the sedation medicines used during the test).    FOLLOW UP: Our staff will call the number listed on your records the next business day following your procedure to check on you and address any questions or concerns that you may have regarding the information given to you following your procedure. If we do not reach you, we will leave a message.  However, if you are feeling well and you are not experiencing any problems, there is no need to return our call.  We will assume that you have returned to your regular daily activities without incident.  If any biopsies were taken you will be contacted by phone or by letter within the next 1-3 weeks.  Please call us at [phone] if you have not heard about the biopsies in 3 weeks.    SIGNATURES/CONFIDENTIALITY: You and/or your care partner have signed paperwork which will be entered into your electronic medical record.  These signatures attest to the fact that that the information above on your After Visit Summary has been reviewed and is understood.  Full responsibility of the  confidentiality of this discharge information lies with you and/or your care-partner.  Thank you for letting us take care of your healthcare needs today.

## 2017-09-23 NOTE — Progress Notes (Signed)
Walking Cytology brushing across street to [facility] via [place].

## 2017-09-23 NOTE — Progress Notes (Signed)
Report given to PACU, vss 

## 2017-09-24 ENCOUNTER — Telehealth: Payer: Self-pay

## 2017-09-24 NOTE — Telephone Encounter (Signed)
  Follow up Call-  Call back number 09/23/2017  Post procedure Call Back phone  # [phone] husband  Permission to leave phone message Yes  Some recent data might be hidden     Patient questions:  Do you have a fever, pain , or abdominal swelling? No. Pain Score  0 *  Have you tolerated food without any problems? Yes.    Have you been able to return to your normal activities? Yes.    Do you have any questions about your discharge instructions: Diet   No. Medications  No. Follow up visit  No.  Do you have questions or concerns about your Care? No.  Actions: * If pain score is 4 or above: No action needed, pain <4.

## 2017-09-28 ENCOUNTER — Other Ambulatory Visit: Payer: Self-pay

## 2017-09-28 MED ORDER — FLUCONAZOLE 200 MG PO TABS: 200.0000 mg | ORAL_TABLET | Freq: Every day | ORAL | 0 refills | Status: DC

## 2017-09-30 ENCOUNTER — Other Ambulatory Visit: Payer: Self-pay

## 2017-09-30 DIAGNOSIS — A048 Other specified bacterial intestinal infections: Secondary | ICD-10-CM

## 2017-10-13 ENCOUNTER — Telehealth: Payer: Self-pay | Admitting: Gastroenterology

## 2017-10-13 NOTE — Telephone Encounter (Signed)
fyi

## 2017-10-14 ENCOUNTER — Other Ambulatory Visit: Payer: Self-pay

## 2017-10-14 MED ORDER — BIS SUBCIT-METRONID-TETRACYC 140-125-125 MG PO CAPS: 3.0000 | ORAL_CAPSULE | Freq: Three times a day (TID) | ORAL | 0 refills | Status: DC

## 2017-10-14 NOTE — Telephone Encounter (Signed)
Rx for Pylera sent to patient's requested pharmacy, to start this after she finishes fluconazole.

## 2017-10-27 ENCOUNTER — Telehealth: Payer: Self-pay | Admitting: Gastroenterology

## 2017-10-27 ENCOUNTER — Other Ambulatory Visit: Payer: Self-pay

## 2017-10-27 NOTE — Telephone Encounter (Signed)
Called patient back, let her know that the next step is to wait one month after finishing the antibiotics to retest for H Pylori, order in Epic.

## 2017-10-29 ENCOUNTER — Encounter (HOSPITAL_COMMUNITY): Payer: Self-pay | Admitting: Emergency Medicine

## 2017-10-29 ENCOUNTER — Inpatient Hospital Stay (HOSPITAL_COMMUNITY)
Admission: EM | Admit: 2017-10-29 | Discharge: 2017-10-31 | DRG: 918 | Disposition: A | Discharge status: Other Acute Inpatient Hospital | Payer: Medicaid Other | Attending: Internal Medicine | Admitting: Internal Medicine

## 2017-10-29 ENCOUNTER — Other Ambulatory Visit: Payer: Self-pay

## 2017-10-29 DIAGNOSIS — F4325 Adjustment disorder with mixed disturbance of emotions and conduct: Secondary | ICD-10-CM

## 2017-10-29 DIAGNOSIS — Z23 Encounter for immunization: Secondary | ICD-10-CM

## 2017-10-29 DIAGNOSIS — Z79899 Other long term (current) drug therapy: Secondary | ICD-10-CM

## 2017-10-29 DIAGNOSIS — T39012A Poisoning by aspirin, intentional self-harm, initial encounter: Principal | ICD-10-CM | POA: Diagnosis present

## 2017-10-29 DIAGNOSIS — T39092A Poisoning by salicylates, intentional self-harm, initial encounter: Secondary | ICD-10-CM

## 2017-10-29 DIAGNOSIS — R45851 Suicidal ideations: Secondary | ICD-10-CM | POA: Diagnosis present

## 2017-10-29 DIAGNOSIS — D509 Iron deficiency anemia, unspecified: Secondary | ICD-10-CM | POA: Diagnosis present

## 2017-10-29 DIAGNOSIS — Z7982 Long term (current) use of aspirin: Secondary | ICD-10-CM

## 2017-10-29 DIAGNOSIS — F4321 Adjustment disorder with depressed mood: Secondary | ICD-10-CM | POA: Diagnosis present

## 2017-10-29 DIAGNOSIS — R4689 Other symptoms and signs involving appearance and behavior: Secondary | ICD-10-CM

## 2017-10-29 DIAGNOSIS — H9319 Tinnitus, unspecified ear: Secondary | ICD-10-CM | POA: Diagnosis present

## 2017-10-29 DIAGNOSIS — Y92009 Unspecified place in unspecified non-institutional (private) residence as the place of occurrence of the external cause: Secondary | ICD-10-CM

## 2017-10-29 DIAGNOSIS — R4589 Other symptoms and signs involving emotional state: Secondary | ICD-10-CM | POA: Diagnosis present

## 2017-10-29 DIAGNOSIS — E876 Hypokalemia: Secondary | ICD-10-CM

## 2017-10-29 DIAGNOSIS — F329 Major depressive disorder, single episode, unspecified: Secondary | ICD-10-CM | POA: Diagnosis present

## 2017-10-29 DIAGNOSIS — T39091A Poisoning by salicylates, accidental (unintentional), initial encounter: Secondary | ICD-10-CM | POA: Diagnosis present

## 2017-10-29 LAB — COMPREHENSIVE METABOLIC PANEL
ALBUMIN: 3.8 g/dL (ref 3.5–5.0)
ALK PHOS: 71 U/L (ref 38–126)
ALT: 21 U/L (ref 14–54)
AST: 23 U/L (ref 15–41)
Anion gap: 10 (ref 5–15)
BUN: 19 mg/dL (ref 6–20)
CALCIUM: 8.9 mg/dL (ref 8.9–10.3)
CO2: 22 mmol/L (ref 22–32)
Chloride: 103 mmol/L (ref 101–111)
Creatinine, Ser: 0.61 mg/dL (ref 0.44–1.00)
GFR calc Af Amer: >60 mL/min (ref >60)
GLUCOSE: 102 mg/dL — AB (ref 65–99)
POTASSIUM: 3.2 mmol/L — AB (ref 3.5–5.1)
Sodium: 135 mmol/L (ref 135–145)
TOTAL PROTEIN: 7.3 g/dL (ref 6.5–8.1)
Total Bilirubin: 0.2 mg/dL — ABNORMAL LOW (ref 0.3–1.2)

## 2017-10-29 LAB — ACETAMINOPHEN LEVEL: Acetaminophen (Tylenol), Serum: <10 ug/mL — ABNORMAL LOW (ref 10–30)

## 2017-10-29 LAB — CBC
HEMATOCRIT: 31.7 % — AB (ref 36.0–46.0)
Hemoglobin: 9.6 g/dL — ABNORMAL LOW (ref 12.0–15.0)
MCH: 21.8 pg — AB (ref 26.0–34.0)
MCHC: 30.3 g/dL (ref 30.0–36.0)
MCV: 72 fL — AB (ref 78.0–100.0)
PLATELETS: 451 10*3/uL — AB (ref 150–400)
RBC: 4.4 MIL/uL (ref 3.87–5.11)
RDW: 17.4 % — AB (ref 11.5–15.5)
WBC: 13.8 10*3/uL — ABNORMAL HIGH (ref 4.0–10.5)

## 2017-10-29 LAB — I-STAT BETA HCG BLOOD, ED (MC, WL, AP ONLY): I-stat hCG, quantitative: <5 m[IU]/mL (ref <5)

## 2017-10-29 LAB — PROTIME-INR
INR: 0.95
Prothrombin Time: 12.6 seconds (ref 11.4–15.2)

## 2017-10-29 LAB — CBG MONITORING, ED: Glucose-Capillary: 81 mg/dL (ref 65–99)

## 2017-10-29 LAB — MAGNESIUM: Magnesium: 2.3 mg/dL (ref 1.7–2.4)

## 2017-10-29 LAB — SALICYLATE LEVEL: Salicylate Lvl: <7 mg/dL (ref 2.8–30.0)

## 2017-10-29 LAB — ETHANOL

## 2017-10-29 MED ORDER — CHARCOAL ACTIVATED PO LIQD
50.0000 g | Freq: Once | ORAL | Status: AC
  Administered 2017-10-29: 50 g via ORAL
  Filled 2017-10-29: qty 240

## 2017-10-29 MED ORDER — SODIUM CHLORIDE 0.9 % IV SOLN
INTRAVENOUS | Status: DC
  Administered 2017-10-30 (×2): via INTRAVENOUS

## 2017-10-29 MED ORDER — SODIUM CHLORIDE 0.9 % IV BOLUS (SEPSIS)
1000.0000 mL | Freq: Once | INTRAVENOUS | Status: AC
  Administered 2017-10-29: 1000 mL via INTRAVENOUS

## 2017-10-29 NOTE — ED Provider Notes (Signed)
[facility] DEPT Provider Note   CSN: [id number] Arrival date & time: 10/29/17  2256     History   Chief Complaint Chief Complaint  Patient presents with  . Drug Overdose    HPI Angela Romero is a 41 y.o. female.  The history is provided by a parent. The history is limited by the condition of the patient (Altered mental status).  She is reported to have grab a handful of aspirin 325 mg tablets and taking them.  This occurred about 30 minutes prior to arriving in the emergency department.  There were no other medications taken.  She is reported to have not been consuming alcohol.  Father does state that she has had attempts to harm herself in the past.  She does have history of depression.  She did admit to nurse who did triage assessment that she was trying to kill herself.  Past Medical History:  Diagnosis Date  . Anemia   . Depression   . Medical history non-contributory   . Memory difficulty   . Problems with hearing     Patient Active Problem List   Diagnosis Date Noted  . Moderate episode of recurrent major depressive disorder ([place]) 02/09/2017  . 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 Leucocytosis 09/26/2014    Past Surgical History:  Procedure Laterality Date  . EYE SURGERY    . Quail    x 4 other 3 as a child  . REFRACTIVE SURGERY  07/2014    OB History    Gravida Para Term Preterm AB Living   0 0 0 0 0     SAB TAB Ectopic Multiple Live Births   0 0 0           Home Medications    Prior to Admission medications   Medication Sig Start Date End Date Taking? Authorizing Provider  B Complex Vitamins (VITAMIN-B COMPLEX PO) Take 1 tablet by mouth daily.    [provider]  bismuth-metronidazole-tetracycline [facility]) [phone] MG capsule Take 3 capsules by mouth 4 (four) times daily -  before meals and at bedtime. 10/14/17   Armbruster, Carlota Raspberry, MD  CALCIUM CARB-CHOLECALCIFEROL PO Take 1 tablet by  mouth as needed.    [provider]  dicyclomine (BENTYL) 20 MG tablet Take 1 tablet by mouth daily. 08/07/17   [provider]  fluconazole (DIFLUCAN) 200 MG tablet Take 1 tablet (200 mg total) by mouth daily. Take two tablets first day, then one tablet daily thereafter for total of 14 days. 09/28/17   Armbruster, Carlota Raspberry, MD  FLUoxetine (PROZAC) 40 MG capsule Take 1 capsule by mouth daily. 05/19/17   [provider]  glucosamine-chondroitin 500-400 MG tablet Take 1 tablet by mouth as needed.    [provider]    Family History Family History  Problem Relation Age of Onset  . Diabetes Mellitus II Mother   . Diabetes Mother   . Prostate cancer Father   . Colon cancer Neg Hx     Social History Social History   Tobacco Use  . Smoking status: Never Smoker  . Smokeless tobacco: Never Used  Substance Use Topics  . Alcohol use: No  . Drug use: No     Allergies   Patient has no known allergies.   Review of Systems Review of Systems  Unable to perform ROS: Mental status change     Physical Exam Updated Vital Signs BP 139/70 (BP Location: Left Arm)  Pulse 95   Temp 98.2 F (36.8 C) (Oral)   Resp 18   SpO2 100%   Physical Exam  Nursing note and vitals reviewed.  41 year old female, resting comfortably and in no acute distress. Vital signs are normal. Oxygen saturation is 100%, which is normal. Head is normocephalic and atraumatic. PERRLA, EOMI. Oropharynx is clear. Neck is nontender and supple without adenopathy or JVD. Back is nontender and there is no CVA tenderness. Lungs are clear without rales, wheezes, or rhonchi. Chest is nontender. Heart has regular rate and rhythm without murmur. Abdomen is soft, flat, nontender without masses or hepatosplenomegaly and peristalsis is normoactive. Extremities have no cyanosis or edema, full range of motion is present. Skin is warm and dry without rash. Neurologic: Awake but nonverbal with  me-mainly staring into space, intermittent generalized myoclonic jerks, cranial nerves are intact, there are no motor or sensory deficits.  ED Treatments / Results  Labs (all labs ordered are listed, but only abnormal results are displayed) Labs Reviewed  COMPREHENSIVE METABOLIC PANEL - Abnormal; Notable for the following components:      Result Value   Potassium 3.2 (*)    Glucose, Bld 102 (*)    Total Bilirubin 0.2 (*)    All other components within normal limits  ACETAMINOPHEN LEVEL - Abnormal; Notable for the following components:   Acetaminophen (Tylenol), Serum <10 (*)    All other components within normal limits  CBC - Abnormal; Notable for the following components:   WBC 13.8 (*)    Hemoglobin 9.6 (*)    HCT 31.7 (*)    MCV 72.0 (*)    MCH 21.8 (*)    RDW 17.4 (*)    Platelets 451 (*)    All other components within normal limits  URINALYSIS, ROUTINE W REFLEX MICROSCOPIC - Abnormal; Notable for the following components:   Color, Urine COLORLESS (*)    Specific Gravity, Urine 1.003 (*)    All other components within normal limits  BLOOD GAS, ARTERIAL - Abnormal; Notable for the following components:   pCO2 arterial 30.9 (*)    Acid-base deficit 3.3 (*)    All other components within normal limits  ACETAMINOPHEN LEVEL - Abnormal; Notable for the following components:   Acetaminophen (Tylenol), Serum <10 (*)    All other components within normal limits  ETHANOL  SALICYLATE LEVEL  RAPID URINE DRUG SCREEN, HOSP PERFORMED  MAGNESIUM  PROTIME-INR  SALICYLATE LEVEL  CBG MONITORING, ED  I-STAT BETA HCG BLOOD, ED ([facility], [facility], [facility] ONLY)  I-STAT BETA HCG BLOOD, ED ([facility], [facility], [facility] ONLY)    EKG  EKG Interpretation  Date/Time:  Thursday October 29 2017 23:17:02 EST Ventricular Rate:  90 PR Interval:    QRS Duration: 91 QT Interval:  395 QTC Calculation: 484 R Axis:   72 Text Interpretation:  Sinus rhythm Normal ECG When compared with ECG of 11/14/2015, HEART RATE has decreased  Confirmed by Delora Fuel [id number]) on 10/29/2017 11:21:39 PM        Procedures Procedures (including critical care time) CRITICAL CARE Performed by: LEXNT,ZGYFV Total critical care time: 60 minutes Critical care time was exclusive of separately billable procedures and treating other patients. Critical care was necessary to treat or prevent imminent or life-threatening deterioration. Critical care was time spent personally by me on the following activities: development of treatment plan with patient and/or surrogate as well as nursing, discussions with consultants, evaluation of patient's response to treatment, examination of patient, obtaining history from patient  or surrogate, ordering and performing treatments and interventions, ordering and review of laboratory studies, ordering and review of radiographic studies, pulse oximetry and re-evaluation of patient's condition.  Medications Ordered in ED Medications  0.9 %  sodium chloride infusion ( Intravenous New Bag/Given 10/30/17 0035)  sodium chloride 0.9 % bolus 1,000 mL (0 mLs Intravenous Stopped 10/30/17 0135)  charcoal activated (NO SORBITOL) (ACTIDOSE-AQUA) suspension 50 g (50 g Oral Given 10/29/17 2347)  potassium chloride 10 mEq in 100 mL IVPB (0 mEq Intravenous Stopped 10/30/17 0159)  ondansetron (ZOFRAN) injection 4 mg (4 mg Intravenous Given 10/30/17 0036)  charcoal activated (NO SORBITOL) (ACTIDOSE-AQUA) suspension 50 g (50 g Oral Given 10/30/17 0413)     Initial Impression / Assessment and Plan / ED Course  I have reviewed the triage vital signs and the nursing notes.  Pertinent labs & imaging results that were available during my care of the patient were reviewed by me and considered in my medical decision making (see chart for details).  Salicylate overdose with suicidal intent.  Poison control has been consulted and will need to check salicylate levels every 3 hours until they are declining.  She is given activated charcoal.   IV fluids are given to encourage urinary excretion. . Initial salicylate level is undetectable.  However, 3-hour level is significantly elevated.  She will need to be admitted until salicylate level is trending down.  Mild hypokalemia is present and treated with intravenous potassium.  She is given second dose of activated charcoal.  Case is discussed with Dr. Hal Hope of [facility], who agrees to admit the patient.  Final Clinical Impressions(s) / ED Diagnoses   Final diagnoses:  Salicylate overdose, intentional self-harm, initial encounter ([place])  Hypokalemia  Microcytic anemia    ED Discharge Orders    None       Delora Fuel, MD 37/85/88 [phone]

## 2017-10-29 NOTE — ED Triage Notes (Addendum)
Patient brought in by father and he states she took over 52 to 40 aspirin. Patient is having involuntary jerking movements. Patient is acting lethargic. Patient father left. Patient states she was trying to kill herself.

## 2017-10-30 ENCOUNTER — Encounter (HOSPITAL_COMMUNITY): Payer: Self-pay | Admitting: Internal Medicine

## 2017-10-30 DIAGNOSIS — F4321 Adjustment disorder with depressed mood: Secondary | ICD-10-CM | POA: Diagnosis present

## 2017-10-30 DIAGNOSIS — Z560 Unemployment, unspecified: Secondary | ICD-10-CM | POA: Diagnosis not present

## 2017-10-30 DIAGNOSIS — T1491XA Suicide attempt, initial encounter: Secondary | ICD-10-CM | POA: Diagnosis not present

## 2017-10-30 DIAGNOSIS — T39091A Poisoning by salicylates, accidental (unintentional), initial encounter: Secondary | ICD-10-CM | POA: Diagnosis not present

## 2017-10-30 DIAGNOSIS — Z23 Encounter for immunization: Secondary | ICD-10-CM | POA: Diagnosis not present

## 2017-10-30 DIAGNOSIS — R4689 Other symptoms and signs involving appearance and behavior: Secondary | ICD-10-CM | POA: Diagnosis not present

## 2017-10-30 DIAGNOSIS — Z79899 Other long term (current) drug therapy: Secondary | ICD-10-CM | POA: Diagnosis not present

## 2017-10-30 DIAGNOSIS — R110 Nausea: Secondary | ICD-10-CM | POA: Diagnosis not present

## 2017-10-30 DIAGNOSIS — E876 Hypokalemia: Secondary | ICD-10-CM | POA: Diagnosis not present

## 2017-10-30 DIAGNOSIS — D509 Iron deficiency anemia, unspecified: Secondary | ICD-10-CM | POA: Diagnosis not present

## 2017-10-30 DIAGNOSIS — F329 Major depressive disorder, single episode, unspecified: Secondary | ICD-10-CM | POA: Diagnosis present

## 2017-10-30 DIAGNOSIS — F4325 Adjustment disorder with mixed disturbance of emotions and conduct: Secondary | ICD-10-CM | POA: Diagnosis present

## 2017-10-30 DIAGNOSIS — R45851 Suicidal ideations: Secondary | ICD-10-CM | POA: Diagnosis present

## 2017-10-30 DIAGNOSIS — Z7982 Long term (current) use of aspirin: Secondary | ICD-10-CM | POA: Diagnosis not present

## 2017-10-30 DIAGNOSIS — R109 Unspecified abdominal pain: Secondary | ICD-10-CM

## 2017-10-30 DIAGNOSIS — T39012A Poisoning by aspirin, intentional self-harm, initial encounter: Secondary | ICD-10-CM | POA: Diagnosis not present

## 2017-10-30 DIAGNOSIS — Y92009 Unspecified place in unspecified non-institutional (private) residence as the place of occurrence of the external cause: Secondary | ICD-10-CM | POA: Diagnosis not present

## 2017-10-30 DIAGNOSIS — T39092A Poisoning by salicylates, intentional self-harm, initial encounter: Secondary | ICD-10-CM | POA: Diagnosis not present

## 2017-10-30 DIAGNOSIS — R4589 Other symptoms and signs involving emotional state: Secondary | ICD-10-CM | POA: Diagnosis present

## 2017-10-30 DIAGNOSIS — H9319 Tinnitus, unspecified ear: Secondary | ICD-10-CM | POA: Diagnosis present

## 2017-10-30 LAB — URINALYSIS, ROUTINE W REFLEX MICROSCOPIC
BILIRUBIN URINE: NEGATIVE
Glucose, UA: NEGATIVE mg/dL
HGB URINE DIPSTICK: NEGATIVE
KETONES UR: NEGATIVE mg/dL
Leukocytes, UA: NEGATIVE
Nitrite: NEGATIVE
PROTEIN: NEGATIVE mg/dL
Specific Gravity, Urine: 1.003 — ABNORMAL LOW (ref 1.005–1.030)
pH: 6 (ref 5.0–8.0)

## 2017-10-30 LAB — COMPREHENSIVE METABOLIC PANEL
ALBUMIN: 3.1 g/dL — AB (ref 3.5–5.0)
ALBUMIN: 3.2 g/dL — AB (ref 3.5–5.0)
ALBUMIN: 3 g/dL — AB (ref 3.5–5.0)
ALK PHOS: 51 U/L (ref 38–126)
ALK PHOS: 50 U/L (ref 38–126)
ALT: 18 U/L (ref 14–54)
ALT: 18 U/L (ref 14–54)
ALT: 19 U/L (ref 14–54)
ALT: 17 U/L (ref 14–54)
ALT: 17 U/L (ref 14–54)
ANION GAP: 7 (ref 5–15)
AST: 19 U/L (ref 15–41)
AST: 19 U/L (ref 15–41)
AST: 20 U/L (ref 15–41)
AST: 19 U/L (ref 15–41)
AST: 17 U/L (ref 15–41)
Albumin: 3.1 g/dL — ABNORMAL LOW (ref 3.5–5.0)
Albumin: 3 g/dL — ABNORMAL LOW (ref 3.5–5.0)
Alkaline Phosphatase: 54 U/L (ref 38–126)
Alkaline Phosphatase: 52 U/L (ref 38–126)
Alkaline Phosphatase: 49 U/L (ref 38–126)
Anion gap: 8 (ref 5–15)
Anion gap: 8 (ref 5–15)
Anion gap: 7 (ref 5–15)
Anion gap: 6 (ref 5–15)
BILIRUBIN TOTAL: 0.3 mg/dL (ref 0.3–1.2)
BILIRUBIN TOTAL: 0.4 mg/dL (ref 0.3–1.2)
BUN: 8 mg/dL (ref 6–20)
BUN: 6 mg/dL (ref 6–20)
BUN: 7 mg/dL (ref 6–20)
BUN: 6 mg/dL (ref 6–20)
BUN: 6 mg/dL (ref 6–20)
CALCIUM: 7.9 mg/dL — AB (ref 8.9–10.3)
CALCIUM: 7.9 mg/dL — AB (ref 8.9–10.3)
CHLORIDE: 110 mmol/L (ref 101–111)
CHLORIDE: 113 mmol/L — AB (ref 101–111)
CHLORIDE: 111 mmol/L (ref 101–111)
CHLORIDE: 112 mmol/L — AB (ref 101–111)
CO2: 22 mmol/L (ref 22–32)
CO2: 20 mmol/L — AB (ref 22–32)
CO2: 21 mmol/L — AB (ref 22–32)
CO2: 21 mmol/L — AB (ref 22–32)
CO2: 20 mmol/L — ABNORMAL LOW (ref 22–32)
CREATININE: 0.6 mg/dL (ref 0.44–1.00)
CREATININE: 0.55 mg/dL (ref 0.44–1.00)
CREATININE: 0.59 mg/dL (ref 0.44–1.00)
Calcium: 8.1 mg/dL — ABNORMAL LOW (ref 8.9–10.3)
Calcium: 7.9 mg/dL — ABNORMAL LOW (ref 8.9–10.3)
Calcium: 8 mg/dL — ABNORMAL LOW (ref 8.9–10.3)
Chloride: 111 mmol/L (ref 101–111)
Creatinine, Ser: 0.55 mg/dL (ref 0.44–1.00)
Creatinine, Ser: 0.62 mg/dL (ref 0.44–1.00)
GFR calc Af Amer: >60 mL/min (ref >60)
GFR calc Af Amer: >60 mL/min (ref >60)
GFR calc Af Amer: >60 mL/min (ref >60)
GFR calc Af Amer: >60 mL/min (ref >60)
GFR calc non Af Amer: >60 mL/min (ref >60)
GFR calc non Af Amer: >60 mL/min (ref >60)
GFR calc non Af Amer: >60 mL/min (ref >60)
GFR calc non Af Amer: >60 mL/min (ref >60)
GLUCOSE: 102 mg/dL — AB (ref 65–99)
GLUCOSE: 99 mg/dL (ref 65–99)
GLUCOSE: 91 mg/dL (ref 65–99)
Glucose, Bld: 100 mg/dL — ABNORMAL HIGH (ref 65–99)
Glucose, Bld: 96 mg/dL (ref 65–99)
POTASSIUM: 3.5 mmol/L (ref 3.5–5.1)
POTASSIUM: 3.7 mmol/L (ref 3.5–5.1)
POTASSIUM: 3.5 mmol/L (ref 3.5–5.1)
Potassium: 3.4 mmol/L — ABNORMAL LOW (ref 3.5–5.1)
Potassium: 3.8 mmol/L (ref 3.5–5.1)
SODIUM: 140 mmol/L (ref 135–145)
SODIUM: 141 mmol/L (ref 135–145)
SODIUM: 139 mmol/L (ref 135–145)
Sodium: 139 mmol/L (ref 135–145)
Sodium: 138 mmol/L (ref 135–145)
TOTAL PROTEIN: 6 g/dL — AB (ref 6.5–8.1)
TOTAL PROTEIN: 5.6 g/dL — AB (ref 6.5–8.1)
TOTAL PROTEIN: 5.6 g/dL — AB (ref 6.5–8.1)
Total Bilirubin: 0.5 mg/dL (ref 0.3–1.2)
Total Bilirubin: 0.2 mg/dL — ABNORMAL LOW (ref 0.3–1.2)
Total Bilirubin: 0.4 mg/dL (ref 0.3–1.2)
Total Protein: 6.1 g/dL — ABNORMAL LOW (ref 6.5–8.1)
Total Protein: 6.1 g/dL — ABNORMAL LOW (ref 6.5–8.1)

## 2017-10-30 LAB — CBG MONITORING, ED: GLUCOSE-CAPILLARY: 110 mg/dL — AB (ref 65–99)

## 2017-10-30 LAB — BLOOD GAS, ARTERIAL
ACID-BASE DEFICIT: 3.3 mmol/L — AB (ref 0.0–2.0)
BICARBONATE: 20.1 mmol/L (ref 20.0–28.0)
Drawn by: 42624
FIO2: 21
O2 Saturation: 97.3 %
PATIENT TEMPERATURE: 98.2
pCO2 arterial: 30.9 mmHg — ABNORMAL LOW (ref 32.0–48.0)
pH, Arterial: 7.427 (ref 7.350–7.450)
pO2, Arterial: 102 mmHg (ref 83.0–108.0)

## 2017-10-30 LAB — BASIC METABOLIC PANEL
ANION GAP: 8 (ref 5–15)
BUN: 10 mg/dL (ref 6–20)
CALCIUM: 8.1 mg/dL — AB (ref 8.9–10.3)
CO2: 20 mmol/L — AB (ref 22–32)
Chloride: 111 mmol/L (ref 101–111)
Creatinine, Ser: 0.6 mg/dL (ref 0.44–1.00)
Glucose, Bld: 125 mg/dL — ABNORMAL HIGH (ref 65–99)
Potassium: 3.5 mmol/L (ref 3.5–5.1)
SODIUM: 139 mmol/L (ref 135–145)

## 2017-10-30 LAB — CBC
HCT: 29.2 % — ABNORMAL LOW (ref 36.0–46.0)
HEMOGLOBIN: 8.8 g/dL — AB (ref 12.0–15.0)
MCH: 21.8 pg — AB (ref 26.0–34.0)
MCHC: 30.1 g/dL (ref 30.0–36.0)
MCV: 72.5 fL — ABNORMAL LOW (ref 78.0–100.0)
PLATELETS: 413 10*3/uL — AB (ref 150–400)
RBC: 4.03 MIL/uL (ref 3.87–5.11)
RDW: 17.6 % — ABNORMAL HIGH (ref 11.5–15.5)
WBC: 10.2 10*3/uL (ref 4.0–10.5)

## 2017-10-30 LAB — RAPID URINE DRUG SCREEN, HOSP PERFORMED
AMPHETAMINES: NOT DETECTED
BARBITURATES: NOT DETECTED
BENZODIAZEPINES: NOT DETECTED
Cocaine: NOT DETECTED
Opiates: NOT DETECTED
TETRAHYDROCANNABINOL: NOT DETECTED

## 2017-10-30 LAB — SALICYLATE LEVEL
SALICYLATE LVL: 28.4 mg/dL (ref 2.8–30.0)
SALICYLATE LVL: 29.2 mg/dL (ref 2.8–30.0)
SALICYLATE LVL: 26.8 mg/dL (ref 2.8–30.0)
SALICYLATE LVL: 16.2 mg/dL (ref 2.8–30.0)
Salicylate Lvl: 28.2 mg/dL (ref 2.8–30.0)
Salicylate Lvl: 19.7 mg/dL (ref 2.8–30.0)

## 2017-10-30 LAB — GLUCOSE, CAPILLARY
GLUCOSE-CAPILLARY: 112 mg/dL — AB (ref 65–99)
Glucose-Capillary: 97 mg/dL (ref 65–99)
Glucose-Capillary: 88 mg/dL (ref 65–99)

## 2017-10-30 LAB — MRSA PCR SCREENING: MRSA BY PCR: NEGATIVE

## 2017-10-30 LAB — MAGNESIUM: Magnesium: 2 mg/dL (ref 1.7–2.4)

## 2017-10-30 LAB — HEPATIC FUNCTION PANEL
ALBUMIN: 3.2 g/dL — AB (ref 3.5–5.0)
ALT: 18 U/L (ref 14–54)
AST: 23 U/L (ref 15–41)
Alkaline Phosphatase: 53 U/L (ref 38–126)
Bilirubin, Direct: <0.1 mg/dL — ABNORMAL LOW (ref 0.1–0.5)
TOTAL PROTEIN: 5.8 g/dL — AB (ref 6.5–8.1)
Total Bilirubin: 0.4 mg/dL (ref 0.3–1.2)

## 2017-10-30 LAB — HIV ANTIBODY (ROUTINE TESTING W REFLEX): HIV Screen 4th Generation wRfx: NONREACTIVE

## 2017-10-30 LAB — ACETAMINOPHEN LEVEL

## 2017-10-30 MED ORDER — SODIUM CHLORIDE 0.9 % IV BOLUS (SEPSIS)
2000.0000 mL | Freq: Once | INTRAVENOUS | Status: AC
  Administered 2017-10-30: 2000 mL via INTRAVENOUS

## 2017-10-30 MED ORDER — INFLUENZA VAC SPLIT QUAD 0.5 ML IM SUSY
0.5000 mL | PREFILLED_SYRINGE | INTRAMUSCULAR | Status: AC
  Administered 2017-10-31: 0.5 mL via INTRAMUSCULAR
  Filled 2017-10-30: qty 0.5

## 2017-10-30 MED ORDER — CHARCOAL ACTIVATED PO LIQD
25.0000 g | Freq: Once | ORAL | Status: AC
Start: 2017-10-30 — End: 2017-10-30
  Administered 2017-10-30: 25 g via ORAL
  Filled 2017-10-30: qty 120

## 2017-10-30 MED ORDER — SODIUM CHLORIDE 0.9 % IV SOLN
INTRAVENOUS | Status: DC
  Administered 2017-10-30 – 2017-10-31 (×6): via INTRAVENOUS

## 2017-10-30 MED ORDER — CHARCOAL ACTIVATED PO LIQD
50.0000 g | Freq: Once | ORAL | Status: AC
  Administered 2017-10-30: 50 g via ORAL
  Filled 2017-10-30: qty 240

## 2017-10-30 MED ORDER — POTASSIUM CHLORIDE CRYS ER 20 MEQ PO TBCR
60.0000 meq | EXTENDED_RELEASE_TABLET | Freq: Once | ORAL | Status: AC
  Administered 2017-10-30: 60 meq via ORAL
  Filled 2017-10-30: qty 3

## 2017-10-30 MED ORDER — POTASSIUM CHLORIDE 10 MEQ/100ML IV SOLN
10.0000 meq | Freq: Once | INTRAVENOUS | Status: AC
  Administered 2017-10-30: 10 meq via INTRAVENOUS
  Filled 2017-10-30: qty 100

## 2017-10-30 MED ORDER — ONDANSETRON HCL 4 MG/2ML IJ SOLN
4.0000 mg | Freq: Once | INTRAMUSCULAR | Status: AC
  Administered 2017-10-30: 4 mg via INTRAVENOUS
  Filled 2017-10-30: qty 2

## 2017-10-30 NOTE — ED Notes (Signed)
Patient assisted to the restroom by 2 nurses. Patient ambulated well with assistance.

## 2017-10-30 NOTE — H&P (Addendum)
History and Physical    Angela Romero [id number] DOB: 1976-04-10 DOA: 10/29/2017  PCP: System, Pcp Not In  Patient coming from: Home.  Chief Complaint: Aspirin overdose.  HPI: Angela Romero is a 41 y.o. female with history of anemia and depression had taken a handful of aspirin tablets with strength of around 350 mg around 10:30 PM last night witnessed by her father.  Patient was brought to the ER.  Patient denies having taken anything else other than the aspirin tablets.  Patient was suicidal.  ED Course: In the ER patient was initially found to be confused.  Salicylate level and Tylenol level at around 11 PM was undetectable.  At around 3 AM salicylate level was around 28.2.  Poison control was called and advised aggressively hydrating and charcoal.  Patient on my exam is alert awake and oriented but has tinnitus and difficulty urinating.  Moves all extremities.  Still depressed and suicidal.  Patient has stated to me she only took aspirin.  Review of Systems: As per HPI, rest all negative.   Past Medical History:  Diagnosis Date  . Anemia   . Depression   . Medical history non-contributory   . Memory difficulty   . Problems with hearing     Past Surgical History:  Procedure Laterality Date  . EYE SURGERY    . [place]    x 4 other 3 as a child  . REFRACTIVE SURGERY  07/2014     reports that  has never smoked. she has never used smokeless tobacco. She reports that she does not drink alcohol or use drugs.  No Known Allergies  Family History  Problem Relation Age of Onset  . Diabetes Mellitus II Mother   . Diabetes Mother   . Prostate cancer Father   . Colon cancer Neg Hx     Prior to Admission medications   Medication Sig Start Date End Date Taking? Authorizing Provider  aspirin 325 MG tablet Take 325 mg daily by mouth.   Yes [provider]  FLUoxetine (PROZAC) 40 MG capsule Take 1 capsule by mouth daily. 05/19/17  Yes [provider]    Physical Exam: Vitals:   10/30/17 0215 10/30/17 0300 10/30/17 0315 10/30/17 0330  BP: 116/65 112/72 116/69 113/69  Pulse: 91 87 86 93  Resp: (!) 22 20 18 19   Temp:      TempSrc:      SpO2: 100% 100% 100% 98%      Constitutional: Moderately built and nourished. Vitals:   10/30/17 0215 10/30/17 0300 10/30/17 0315 10/30/17 0330  BP: 116/65 112/72 116/69 113/69  Pulse: 91 87 86 93  Resp: (!) 22 20 18 19   Temp:      TempSrc:      SpO2: 100% 100% 100% 98%   Eyes: Anicteric no pallor. ENMT: No discharge from the ears eyes nose or mouth. Neck: No mass felt.  No neck rigidity. Respiratory: No rhonchi or crepitations. Cardiovascular: S1-S2 heard. Abdomen: Soft nontender bowel sounds present. Musculoskeletal: No edema.  No joint effusion. Skin: No rash. Neurologic: Alert awake oriented to time place and person.  Has some difficulty hearing.  Moves all extremities. Psychiatric: Depressed and suicidal.   Labs on Admission: I have personally reviewed following labs and imaging studies  CBC: Recent Labs  Lab 10/29/17 2310  WBC 13.8*  HGB 9.6*  HCT 31.7*  MCV 72.0*  PLT 588*   Basic Metabolic Panel: Recent Labs  Lab 10/29/17  2310 10/29/17 2340  NA 135  --   K 3.2*  --   CL 103  --   CO2 22  --   GLUCOSE 102*  --   BUN 19  --   CREATININE 0.61  --   CALCIUM 8.9  --   MG  --  2.3   GFR: CrCl cannot be calculated (Unknown ideal weight.). Liver Function Tests: Recent Labs  Lab 10/29/17 2310  AST 23  ALT 21  ALKPHOS 71  BILITOT 0.2*  PROT 7.3  ALBUMIN 3.8   No results for input(s): LIPASE, AMYLASE in the last 168 hours. No results for input(s): AMMONIA in the last 168 hours. Coagulation Profile: Recent Labs  Lab 10/29/17 2340  INR 0.95   Cardiac Enzymes: No results for input(s): CKTOTAL, CKMB, CKMBINDEX, TROPONINI in the last 168 hours. BNP (last 3 results) No results for input(s): PROBNP in the last 8760 hours. HbA1C: No results  for input(s): HGBA1C in the last 72 hours. CBG: Recent Labs  Lab 10/29/17 2327  GLUCAP 81   Lipid Profile: No results for input(s): CHOL, HDL, LDLCALC, TRIG, CHOLHDL, LDLDIRECT in the last 72 hours. Thyroid Function Tests: No results for input(s): TSH, T4TOTAL, FREET4, T3FREE, THYROIDAB in the last 72 hours. Anemia Panel: No results for input(s): VITAMINB12, FOLATE, FERRITIN, TIBC, IRON, RETICCTPCT in the last 72 hours. Urine analysis:    Component Value Date/Time   COLORURINE COLORLESS (A) 10/29/2017 2329   APPEARANCEUR CLEAR 10/29/2017 2329   LABSPEC 1.003 (L) 10/29/2017 2329   PHURINE 6.0 10/29/2017 2329   GLUCOSEU NEGATIVE 10/29/2017 2329   HGBUR NEGATIVE 10/29/2017 2329   BILIRUBINUR NEGATIVE 10/29/2017 2329   KETONESUR NEGATIVE 10/29/2017 2329   PROTEINUR NEGATIVE 10/29/2017 2329   UROBILINOGEN 0.2 09/25/2014 2312   NITRITE NEGATIVE 10/29/2017 2329   LEUKOCYTESUR NEGATIVE 10/29/2017 2329   Sepsis Labs: @LABRCNTIP (procalcitonin:4,lacticidven:4) )No results found for this or any previous visit (from the past 240 hour(s)).   Radiological Exams on Admission: No results found.  EKG: Independently reviewed.  Normal sinus rhythm with QRS of 91 ms and QTC of 484 ms.  Assessment/Plan Principal Problem:   Salicylate overdose Active Problems:   Suicidal behavior    1. Intentional overdose with salicylates suicidal -poison control has recommended salicylate levels to be checked every 3 hours along with metabolic panel to closely monitor the potassium magnesium levels and acid base status.  Also monitor patient's mental status and clinical symptoms.  To aggressively hydrate. 2. Depression and suicidal -patient placed on suicide precautions.  Will need psychiatric consult. 3. Chronic anemia -follow CBC.  Will need further workup as outpatient if hemoglobin remains stable. 4. Previous history of mushroom poisoning.  I have reviewed patient's old charts and labs.  Addendum  -discussed with poison control after salicylate levels at 606 number ninth 2018 return.  Levels were 28.4.  Poison control advised to give 25 g of activated charcoal and continue to monitor salicylate level every 3 hours along with anabolic panel.  Also advised to increase IV fluids to 250 cc/h along with fluid bolus.   DVT prophylaxis: SCDs. Code Status: Full code. Family Communication: No family at the bedside. Disposition Plan: To be determined. Consults called: None. Admission status: Inpatient.   Rise Patience MD [facility] Pager [phone].  If 7PM-7AM, please contact night-coverage [email or web address] Password [facility]  10/30/2017, 5:36 AM

## 2017-10-30 NOTE — ED Notes (Addendum)
Bed ready Rm 1230

## 2017-10-30 NOTE — ED Notes (Signed)
Patient 1/2 way through drinking charcoal, taking a break to let Zofran reduce nausea. patient suddenly burst into tears and screamed " I don't want anymore, I just want to die."

## 2017-10-30 NOTE — Consult Note (Signed)
[facility] Face-to-Face Psychiatry Consult   Reason for Consult: Suicide risk assessment Referring Physician:  Dr. Hal Hope Patient Identification: Angela Romero MRN:  [id number] Principal Diagnosis: Adjustment disorder with mixed disturbance of emotions and conduct Diagnosis:   Patient Active Problem List   Diagnosis Date Noted  . Salicylate overdose [T66.060O] 10/30/2017  . Suicidal behavior [R46.89] 10/30/2017  . Moderate episode of recurrent major depressive disorder ([place]) [F33.1] 02/09/2017  . 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 Leucocytosis [D72.829] 09/26/2014    Total Time spent with patient: 1 hour  Subjective:   Angela Romero is a 41 y.o. female patient admitted with suicide attempt by aspirin overdose.  HPI:   According to medical records, patient has a history of depression She took a handful of aspirin tablets with strength of around 350 mg around 10:30 PM last night. Her husband witnessed her take them and brought her to the ED. She denies taking anything else. Her salicylate level was elevated to 28.2. UDS was negative. Poison control was called and recommended aggressively hydrating and administering charcoal. She reported tinnitus and difficulty urinating.   On interview, Ms. 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 She reports that she was depressed yesterday and took Aspirin. She does not remember how much. She reports doing well until yesterday. She was unable to provide information for what triggered her attempt. She denies a history of prior suicide attempts. She denies current SI. She denies problems with sleep, appetite or anhedonia. She sees Dr. Burney Gauze for medication management. Her last appointment was a year ago but she still has refills of her medication. She reports compliance with Prozac 40 mg daily. She denies AVH.   Spoke to patient's husband briefly with her consent. He was spoken to separately. He reports that  yesterday she became upset about something that happened months ago and she took a handful of aspirin in front of him. He was unable to provide details about what she became upset about. He confirms that she has not harmed herself in the past. He denies access to weapons at home.    Past Psychiatric History: Depression   Risk to Self: Is patient at risk for suicide?: Yes Risk to Others:  None  Prior Inpatient Therapy:  Denies  Prior Outpatient Therapy:  Her medications are prescribed by Dr. Burney Gauze. She has a therapist but has not seen her recently.   Past Medical History:  Past Medical History:  Diagnosis Date  . Anemia   . Depression   . Medical history non-contributory   . Memory difficulty   . Problems with hearing     Past Surgical History:  Procedure Laterality Date  . EYE SURGERY    . [place]    x 4 other 3 as a child  . REFRACTIVE SURGERY  07/2014   Family History:  Family History  Problem Relation Age of Onset  . Diabetes Mellitus II Mother   . Diabetes Mother   . Prostate cancer Father   . Colon cancer Neg Hx    Family Psychiatric  History: Unknown  Social History:  Social History   Substance and Sexual Activity  Alcohol Use No     Social History   Substance and Sexual Activity  Drug Use No    Social History   Socioeconomic History  . Marital status: Unknown    Spouse name: None  . Number of children: 3  . Years of education: None  . Highest  education level: None  Social Needs  . Financial resource strain: None  . Food insecurity - worry: None  . Food insecurity - inability: None  . Transportation needs - medical: None  . Transportation needs - non-medical: None  Occupational History  . None  Tobacco Use  . Smoking status: Never Smoker  . Smokeless tobacco: Never Used  Substance and Sexual Activity  . Alcohol use: No  . Drug use: No  . Sexual activity: Yes    Birth control/protection: None  Other Topics Concern  . None   Social History Narrative   ** Merged History Encounter **       Additional Social History: She lives at home with her husband of 23 years. They have 3 children but only one child that still lives at home. She is unemployed. She denies alcohol, drug or illicit substance use.     Allergies:  No Known Allergies  Labs:  Results for orders placed or performed during the hospital encounter of 10/29/17 (from the past 48 hour(s))  Comprehensive metabolic panel     Status: Abnormal   Collection Time: 10/29/17 11:10 PM  Result Value Ref Range   Sodium 135 135 - 145 mmol/L   Potassium 3.2 (L) 3.5 - 5.1 mmol/L   Chloride 103 101 - 111 mmol/L   CO2 22 22 - 32 mmol/L   Glucose, Bld 102 (H) 65 - 99 mg/dL   BUN 19 6 - 20 mg/dL   Creatinine, Ser 0.61 0.44 - 1.00 mg/dL   Calcium 8.9 8.9 - 10.3 mg/dL   Total Protein 7.3 6.5 - 8.1 g/dL   Albumin 3.8 3.5 - 5.0 g/dL   AST 23 15 - 41 U/L   ALT 21 14 - 54 U/L   Alkaline Phosphatase 71 38 - 126 U/L   Total Bilirubin 0.2 (L) 0.3 - 1.2 mg/dL   GFR calc non Af Amer >60 >60 mL/min   GFR calc Af Amer >60 >60 mL/min    Comment: (NOTE) The eGFR has been calculated using the CKD EPI equation. This calculation has not been validated in all clinical situations. eGFR's persistently <60 mL/min signify possible Chronic Kidney Disease.    Anion gap 10 5 - 15  Ethanol     Status: None   Collection Time: 10/29/17 11:10 PM  Result Value Ref Range   Alcohol, Ethyl (B) <10 <10 mg/dL    Comment:        LOWEST DETECTABLE LIMIT FOR SERUM ALCOHOL IS 10 mg/dL FOR MEDICAL PURPOSES ONLY   Salicylate level     Status: None   Collection Time: 10/29/17 11:10 PM  Result Value Ref Range   Salicylate Lvl <6.7 2.8 - 30.0 mg/dL  Acetaminophen level     Status: Abnormal   Collection Time: 10/29/17 11:10 PM  Result Value Ref Range   Acetaminophen (Tylenol), Serum <10 (L) 10 - 30 ug/mL    Comment:        THERAPEUTIC CONCENTRATIONS VARY SIGNIFICANTLY. A RANGE OF  10-30 ug/mL MAY BE AN EFFECTIVE CONCENTRATION FOR MANY PATIENTS. HOWEVER, SOME ARE BEST TREATED AT CONCENTRATIONS OUTSIDE THIS RANGE. ACETAMINOPHEN CONCENTRATIONS >150 ug/mL AT 4 HOURS AFTER INGESTION AND >50 ug/mL AT 12 HOURS AFTER INGESTION ARE OFTEN ASSOCIATED WITH TOXIC REACTIONS.   cbc     Status: Abnormal   Collection Time: 10/29/17 11:10 PM  Result Value Ref Range   WBC 13.8 (H) 4.0 - 10.5 K/uL   RBC 4.40 3.87 - 5.11 MIL/uL   Hemoglobin  9.6 (L) 12.0 - 15.0 g/dL   HCT 31.7 (L) 36.0 - 46.0 %   MCV 72.0 (L) 78.0 - 100.0 fL   MCH 21.8 (L) 26.0 - 34.0 pg   MCHC 30.3 30.0 - 36.0 g/dL   RDW 17.4 (H) 11.5 - 15.5 %   Platelets 451 (H) 150 - 400 K/uL  CBG monitoring, ED     Status: None   Collection Time: 10/29/17 11:27 PM  Result Value Ref Range   Glucose-Capillary 81 65 - 99 mg/dL  Urine rapid drug screen (hosp performed)     Status: None   Collection Time: 10/29/17 11:29 PM  Result Value Ref Range   Opiates NONE DETECTED NONE DETECTED   Cocaine NONE DETECTED NONE DETECTED   Benzodiazepines NONE DETECTED NONE DETECTED   Amphetamines NONE DETECTED NONE DETECTED   Tetrahydrocannabinol NONE DETECTED NONE DETECTED   Barbiturates NONE DETECTED NONE DETECTED    Comment:        DRUG SCREEN FOR MEDICAL PURPOSES ONLY.  IF CONFIRMATION IS NEEDED FOR ANY PURPOSE, NOTIFY LAB WITHIN 5 DAYS.        LOWEST DETECTABLE LIMITS FOR URINE DRUG SCREEN Drug Class       Cutoff (ng/mL) Amphetamine      1000 Barbiturate      200 Benzodiazepine   291 Tricyclics       916 Opiates          300 Cocaine          300 THC              50   Urinalysis, Routine w reflex microscopic     Status: Abnormal   Collection Time: 10/29/17 11:29 PM  Result Value Ref Range   Color, Urine COLORLESS (A) YELLOW   APPearance CLEAR CLEAR   Specific Gravity, Urine 1.003 (L) 1.005 - 1.030   pH 6.0 5.0 - 8.0   Glucose, UA NEGATIVE NEGATIVE mg/dL   Hgb urine dipstick NEGATIVE NEGATIVE   Bilirubin Urine  NEGATIVE NEGATIVE   Ketones, ur NEGATIVE NEGATIVE mg/dL   Protein, ur NEGATIVE NEGATIVE mg/dL   Nitrite NEGATIVE NEGATIVE   Leukocytes, UA NEGATIVE NEGATIVE  I-Stat beta hCG blood, ED     Status: None   Collection Time: 10/29/17 11:32 PM  Result Value Ref Range   I-stat hCG, quantitative <5.0 <5 mIU/mL   Comment 3            Comment:   GEST. AGE      CONC.  (mIU/mL)   <=1 WEEK        5 - 50     2 WEEKS       50 - 500     3 WEEKS       100 - 10,000     4 WEEKS     1,000 - 30,000        FEMALE AND NON-PREGNANT FEMALE:     LESS THAN 5 mIU/mL   Magnesium     Status: None   Collection Time: 10/29/17 11:40 PM  Result Value Ref Range   Magnesium 2.3 1.7 - 2.4 mg/dL  Protime-INR     Status: None   Collection Time: 10/29/17 11:40 PM  Result Value Ref Range   Prothrombin Time 12.6 11.4 - 15.2 seconds   INR 0.95   Blood gas, arterial     Status: Abnormal   Collection Time: 10/29/17 11:40 PM  Result Value Ref Range   FIO2 21.00  pH, Arterial 7.427 7.350 - 7.450   pCO2 arterial 30.9 (L) 32.0 - 48.0 mmHg   pO2, Arterial 102 83.0 - 108.0 mmHg   Bicarbonate 20.1 20.0 - 28.0 mmol/L   Acid-base deficit 3.3 (H) 0.0 - 2.0 mmol/L   O2 Saturation 97.3 %   Patient temperature 98.2    Collection site RIGHT RADIAL    Drawn by [phone]    Sample type ARTERIAL DRAW    Allens test (pass/fail) PASS PASS  Salicylate level     Status: None   Collection Time: 10/30/17  3:15 AM  Result Value Ref Range   Salicylate Lvl 78.2 2.8 - 30.0 mg/dL  Acetaminophen level     Status: Abnormal   Collection Time: 10/30/17  3:15 AM  Result Value Ref Range   Acetaminophen (Tylenol), Serum <10 (L) 10 - 30 ug/mL    Comment:        THERAPEUTIC CONCENTRATIONS VARY SIGNIFICANTLY. A RANGE OF 10-30 ug/mL MAY BE AN EFFECTIVE CONCENTRATION FOR MANY PATIENTS. HOWEVER, SOME ARE BEST TREATED AT CONCENTRATIONS OUTSIDE THIS RANGE. ACETAMINOPHEN CONCENTRATIONS >150 ug/mL AT 4 HOURS AFTER INGESTION AND >50 ug/mL AT  12 HOURS AFTER INGESTION ARE OFTEN ASSOCIATED WITH TOXIC REACTIONS.   Basic metabolic panel     Status: Abnormal   Collection Time: 10/30/17  6:05 AM  Result Value Ref Range   Sodium 139 135 - 145 mmol/L   Potassium 3.5 3.5 - 5.1 mmol/L   Chloride 111 101 - 111 mmol/L   CO2 20 (L) 22 - 32 mmol/L   Glucose, Bld 125 (H) 65 - 99 mg/dL   BUN 10 6 - 20 mg/dL   Creatinine, Ser 0.60 0.44 - 1.00 mg/dL   Calcium 8.1 (L) 8.9 - 10.3 mg/dL   GFR calc non Af Amer >60 >60 mL/min   GFR calc Af Amer >60 >60 mL/min    Comment: (NOTE) The eGFR has been calculated using the CKD EPI equation. This calculation has not been validated in all clinical situations. eGFR's persistently <60 mL/min signify possible Chronic Kidney Disease.    Anion gap 8 5 - 15  CBC     Status: Abnormal   Collection Time: 10/30/17  6:05 AM  Result Value Ref Range   WBC 10.2 4.0 - 10.5 K/uL   RBC 4.03 3.87 - 5.11 MIL/uL   Hemoglobin 8.8 (L) 12.0 - 15.0 g/dL   HCT 29.2 (L) 36.0 - 46.0 %   MCV 72.5 (L) 78.0 - 100.0 fL   MCH 21.8 (L) 26.0 - 34.0 pg   MCHC 30.1 30.0 - 36.0 g/dL   RDW 17.6 (H) 11.5 - 15.5 %   Platelets 413 (H) 956 - 213 K/uL  Salicylate level     Status: None   Collection Time: 10/30/17  6:05 AM  Result Value Ref Range   Salicylate Lvl 08.6 2.8 - 30.0 mg/dL  Hepatic function panel     Status: Abnormal   Collection Time: 10/30/17  6:05 AM  Result Value Ref Range   Total Protein 5.8 (L) 6.5 - 8.1 g/dL   Albumin 3.2 (L) 3.5 - 5.0 g/dL   AST 23 15 - 41 U/L   ALT 18 14 - 54 U/L   Alkaline Phosphatase 53 38 - 126 U/L   Total Bilirubin 0.4 0.3 - 1.2 mg/dL   Bilirubin, Direct <0.1 (L) 0.1 - 0.5 mg/dL   Indirect Bilirubin NOT CALCULATED 0.3 - 0.9 mg/dL  CBG monitoring, ED     Status: Abnormal  Collection Time: 10/30/17  6:18 AM  Result Value Ref Range   Glucose-Capillary 110 (H) 65 - 99 mg/dL    Current Facility-Administered Medications  Medication Dose Route Frequency Provider Last Rate Last Dose  .  0.9 %  sodium chloride infusion   Intravenous Continuous Rise Patience, MD      . charcoal activated (NO SORBITOL) (ACTIDOSE-AQUA) suspension 25 g  25 g Oral Once Rise Patience, MD        Musculoskeletal: Strength & Muscle Tone: within normal limits Gait & Station: normal Patient leans: N/A  Psychiatric Specialty Exam: Physical Exam  Nursing note and vitals reviewed. Constitutional: She is oriented to person, place, and time. She appears well-developed and well-nourished.  HENT:  Head: Normocephalic and atraumatic.  Neck: Normal range of motion.  Respiratory: Effort normal.  Musculoskeletal: Normal range of motion.  Neurological: She is alert and oriented to person, place, and time.  Skin: No rash noted.  Psychiatric: Her behavior is normal. Thought content normal.    Review of Systems  Constitutional: Negative for chills and fever.  HENT: Positive for tinnitus.   Gastrointestinal: Positive for abdominal pain and nausea.  Psychiatric/Behavioral: Positive for depression. Negative for hallucinations, substance abuse and suicidal ideas. The patient is not nervous/anxious and does not have insomnia.     Blood pressure 107/62, pulse 91, temperature 98.8 F (37.1 C), temperature source Oral, resp. rate 18, SpO2 97 %.There is no height or weight on file to calculate BMI.  General Appearance: Well Groomed, young female who is wearing a hospital gown and appears uncomfortable secondary to nausea while lying supine in bed. NAD.   Eye Contact:  Good  Speech:  Slow  Volume:  Decreased  Mood:  Dysphoric  Affect:  Congruent and Constricted  Thought Process:  Goal Directed and Linear  Orientation:  Full (Time, Place, and Person)  Thought Content:  Logical  Suicidal Thoughts:  No  Homicidal Thoughts:  No  Memory:  Immediate;   Fair Recent;   Poor Remote;   Fair  Judgement:  Impaired  Insight:  Poor  Psychomotor Activity:  Normal  Concentration:  Concentration: Good and  Attention Span: Good  Recall:  Good  Fund of Knowledge:  Good  Language:  Good  Akathisia:  No  Handed:  Right  AIMS (if indicated):   N/A  Assets:  Agricultural consultant Housing Social Support  ADL's:  Intact  Cognition:  WNL  Sleep:   Okay   Assessment: Kaavya Puskarich is a 41 y.o. female who was admitted for suicide attempt by aspirin overdose. It appears that her attempt was impulsive secondary to a possible argument with her husband. She denies recent depressive symptoms. She warrants inpatient psychiatric hospitalization given the severity of her attempt and high risk of harm to self.   Treatment Plan Summary: -Patient warrants inpatient psychiatric hospitalization given high risk of harm to self. -Continue Engineer, materials.  -Continue home medications: Prozac 40 mg daily for depression.  -Please pursue involuntary commitment if patient refuses voluntary psychiatric hospitalization or attempts to leave the hospital.  -Will sign off on patient at this time. Please consult psychiatry again as needed.    Disposition: Recommend psychiatric Inpatient admission when medically cleared.  Faythe Dingwall, DO 10/30/2017 8:37 AM
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
## 2017-10-30 NOTE — Progress Notes (Signed)
Patient ID: Angela Romero, female   DOB: [birth year], 41 y.o.   MRN: [id number] Patient was admitted early this morning for aspirin overdose and suicidal ideation. The patient was seen and examined at bedside and plan of care was discussed by me. I reviewed the patient's medical records and this morning's H&P myself. Continue IV hydration and keep checking salicylate level every 3 hours as per poison control. Psychiatric evaluation has been requested.

## 2017-10-30 NOTE — Plan of Care (Signed)
  Progressing Education: Knowledge of General Education information will improve 10/30/2017 1825 - Progressing by Naomie Dean, RN Clinical Measurements: Ability to maintain clinical measurements within normal limits will improve 10/30/2017 1825 - Progressing by Naomie Dean, RN Will remain free from infection 10/30/2017 1825 - Progressing by Naomie Dean, RN Diagnostic test results will improve 10/30/2017 1825 - Progressing by Naomie Dean, RN Respiratory complications will improve 10/30/2017 1825 - Progressing by Naomie Dean, RN Cardiovascular complication will be avoided 10/30/2017 1825 - Progressing by Naomie Dean, RN Activity: Risk for activity intolerance will decrease 10/30/2017 1825 - Progressing by Naomie Dean, RN Nutrition: Adequate nutrition will be maintained 10/30/2017 1825 - Progressing by Naomie Dean, RN Coping: Level of anxiety will decrease 10/30/2017 1825 - Progressing by Naomie Dean, RN Elimination: Will not experience complications related to bowel motility 10/30/2017 1825 - Progressing by Naomie Dean, RN Will not experience complications related to urinary retention 10/30/2017 1825 - Progressing by Naomie Dean, RN Pain Managment: General experience of comfort will improve 10/30/2017 1825 - Progressing by Naomie Dean, RN Safety: Ability to remain free from injury will improve 10/30/2017 1825 - Progressing by Naomie Dean, RN Skin Integrity: Risk for impaired skin integrity will decrease 10/30/2017 1825 - Progressing by Naomie Dean, RN   Adequate for Discharge Health Behavior/Discharge Planning: Ability to manage health-related needs will improve 10/30/2017 1825 - Adequate for Discharge by Naomie Dean, RN Spiritual Needs Ability to  function at adequate level 10/30/2017 1825 - Adequate for Discharge by Naomie Dean, RN

## 2017-10-30 NOTE — ED Notes (Signed)
Contact information  Angela Romero [id number], husband,  Cell [phone]

## 2017-10-30 NOTE — ED Notes (Signed)
Patient completed charcoal Pt does report tinnitus

## 2017-10-30 NOTE — ED Notes (Addendum)
PT assigned RM 1230 ED Rn have been made aware

## 2017-10-31 ENCOUNTER — Encounter (HOSPITAL_COMMUNITY): Payer: Self-pay | Admitting: *Deleted

## 2017-10-31 ENCOUNTER — Other Ambulatory Visit: Payer: Self-pay

## 2017-10-31 ENCOUNTER — Inpatient Hospital Stay (HOSPITAL_COMMUNITY)
Admission: AD | Admit: 2017-10-31 | Discharge: 2017-11-04 | DRG: 885 | Disposition: A | Discharge status: Home / Self Care | Payer: Medicaid Other | Source: Intra-hospital | Attending: Psychiatry | Admitting: Psychiatry

## 2017-10-31 DIAGNOSIS — F332 Major depressive disorder, recurrent severe without psychotic features: Secondary | ICD-10-CM | POA: Diagnosis not present

## 2017-10-31 DIAGNOSIS — Z79899 Other long term (current) drug therapy: Secondary | ICD-10-CM | POA: Diagnosis not present

## 2017-10-31 DIAGNOSIS — F4325 Adjustment disorder with mixed disturbance of emotions and conduct: Secondary | ICD-10-CM | POA: Diagnosis not present

## 2017-10-31 DIAGNOSIS — H9319 Tinnitus, unspecified ear: Secondary | ICD-10-CM | POA: Diagnosis present

## 2017-10-31 DIAGNOSIS — R4689 Other symptoms and signs involving appearance and behavior: Secondary | ICD-10-CM | POA: Diagnosis not present

## 2017-10-31 DIAGNOSIS — F419 Anxiety disorder, unspecified: Secondary | ICD-10-CM | POA: Diagnosis present

## 2017-10-31 DIAGNOSIS — D509 Iron deficiency anemia, unspecified: Secondary | ICD-10-CM | POA: Diagnosis not present

## 2017-10-31 DIAGNOSIS — G4700 Insomnia, unspecified: Secondary | ICD-10-CM | POA: Diagnosis present

## 2017-10-31 DIAGNOSIS — T39092A Poisoning by salicylates, intentional self-harm, initial encounter: Secondary | ICD-10-CM

## 2017-10-31 DIAGNOSIS — F39 Unspecified mood [affective] disorder: Secondary | ICD-10-CM | POA: Diagnosis not present

## 2017-10-31 DIAGNOSIS — E876 Hypokalemia: Secondary | ICD-10-CM

## 2017-10-31 DIAGNOSIS — Z915 Personal history of self-harm: Secondary | ICD-10-CM

## 2017-10-31 DIAGNOSIS — T39012A Poisoning by aspirin, intentional self-harm, initial encounter: Secondary | ICD-10-CM | POA: Diagnosis not present

## 2017-10-31 DIAGNOSIS — T39091A Poisoning by salicylates, accidental (unintentional), initial encounter: Secondary | ICD-10-CM | POA: Diagnosis not present

## 2017-10-31 DIAGNOSIS — Z818 Family history of other mental and behavioral disorders: Secondary | ICD-10-CM | POA: Diagnosis not present

## 2017-10-31 DIAGNOSIS — Z6379 Other stressful life events affecting family and household: Secondary | ICD-10-CM | POA: Diagnosis not present

## 2017-10-31 DIAGNOSIS — T1491XA Suicide attempt, initial encounter: Secondary | ICD-10-CM | POA: Diagnosis not present

## 2017-10-31 LAB — COMPREHENSIVE METABOLIC PANEL
ALBUMIN: 3 g/dL — AB (ref 3.5–5.0)
ALK PHOS: 49 U/L (ref 38–126)
ALT: 16 U/L (ref 14–54)
ALT: 17 U/L (ref 14–54)
ANION GAP: 7 (ref 5–15)
ANION GAP: 7 (ref 5–15)
AST: 18 U/L (ref 15–41)
AST: 19 U/L (ref 15–41)
Albumin: 2.9 g/dL — ABNORMAL LOW (ref 3.5–5.0)
Alkaline Phosphatase: 52 U/L (ref 38–126)
BILIRUBIN TOTAL: 0.7 mg/dL (ref 0.3–1.2)
BUN: 6 mg/dL (ref 6–20)
BUN: 6 mg/dL (ref 6–20)
CALCIUM: 8 mg/dL — AB (ref 8.9–10.3)
CHLORIDE: 112 mmol/L — AB (ref 101–111)
CO2: 20 mmol/L — ABNORMAL LOW (ref 22–32)
CO2: 21 mmol/L — ABNORMAL LOW (ref 22–32)
CREATININE: 0.55 mg/dL (ref 0.44–1.00)
Calcium: 8.1 mg/dL — ABNORMAL LOW (ref 8.9–10.3)
Chloride: 111 mmol/L (ref 101–111)
Creatinine, Ser: 0.57 mg/dL (ref 0.44–1.00)
GFR calc Af Amer: >60 mL/min (ref >60)
GFR calc non Af Amer: >60 mL/min (ref >60)
GLUCOSE: 85 mg/dL (ref 65–99)
Glucose, Bld: 87 mg/dL (ref 65–99)
POTASSIUM: 3.3 mmol/L — AB (ref 3.5–5.1)
Potassium: 3.4 mmol/L — ABNORMAL LOW (ref 3.5–5.1)
SODIUM: 138 mmol/L (ref 135–145)
SODIUM: 140 mmol/L (ref 135–145)
TOTAL PROTEIN: 5.8 g/dL — AB (ref 6.5–8.1)
TOTAL PROTEIN: 5.7 g/dL — AB (ref 6.5–8.1)
Total Bilirubin: 0.5 mg/dL (ref 0.3–1.2)

## 2017-10-31 LAB — CBC WITH DIFFERENTIAL/PLATELET
BASOS ABS: 0 10*3/uL (ref 0.0–0.1)
BASOS PCT: 1 %
EOS PCT: 3 %
Eosinophils Absolute: 0.2 10*3/uL (ref 0.0–0.7)
HEMATOCRIT: 27.9 % — AB (ref 36.0–46.0)
Hemoglobin: 8.3 g/dL — ABNORMAL LOW (ref 12.0–15.0)
LYMPHS PCT: 33 %
Lymphs Abs: 2.8 10*3/uL (ref 0.7–4.0)
MCH: 21.7 pg — ABNORMAL LOW (ref 26.0–34.0)
MCHC: 29.7 g/dL — ABNORMAL LOW (ref 30.0–36.0)
MCV: 73 fL — ABNORMAL LOW (ref 78.0–100.0)
MONO ABS: 0.4 10*3/uL (ref 0.1–1.0)
Monocytes Relative: 5 %
NEUTROS ABS: 4.9 10*3/uL (ref 1.7–7.7)
Neutrophils Relative %: 58 %
PLATELETS: 379 10*3/uL (ref 150–400)
RBC: 3.82 MIL/uL — ABNORMAL LOW (ref 3.87–5.11)
RDW: 17.8 % — AB (ref 11.5–15.5)
WBC: 8.4 10*3/uL (ref 4.0–10.5)

## 2017-10-31 LAB — GLUCOSE, CAPILLARY
Glucose-Capillary: 74 mg/dL (ref 65–99)
Glucose-Capillary: 92 mg/dL (ref 65–99)

## 2017-10-31 LAB — SALICYLATE LEVEL
SALICYLATE LVL: 13.5 mg/dL (ref 2.8–30.0)
Salicylate Lvl: 10.8 mg/dL (ref 2.8–30.0)

## 2017-10-31 LAB — PROTIME-INR
INR: 1.18
Prothrombin Time: 14.9 seconds (ref 11.4–15.2)

## 2017-10-31 MED ORDER — ALUM & MAG HYDROXIDE-SIMETH 200-200-20 MG/5ML PO SUSP: 30.0000 mL | ORAL | Status: DC | PRN

## 2017-10-31 MED ORDER — MAGNESIUM HYDROXIDE 400 MG/5ML PO SUSP: 30.0000 mL | Freq: Every day | ORAL | Status: DC | PRN

## 2017-10-31 MED ORDER — HYDROXYZINE HCL 25 MG PO TABS
25.0000 mg | ORAL_TABLET | Freq: Three times a day (TID) | ORAL | Status: DC | PRN
  Administered 2017-10-31 – 2017-11-01 (×2): 25 mg via ORAL
  Filled 2017-10-31 (×2): qty 1

## 2017-10-31 MED ORDER — TRAZODONE HCL 50 MG PO TABS
50.0000 mg | ORAL_TABLET | Freq: Every evening | ORAL | Status: DC | PRN
  Administered 2017-11-02 – 2017-11-03 (×2): 50 mg via ORAL
  Filled 2017-10-31 (×2): qty 1

## 2017-10-31 MED ORDER — FLUOXETINE HCL 20 MG PO CAPS
40.0000 mg | ORAL_CAPSULE | Freq: Every day | ORAL | Status: DC
  Administered 2017-11-01 – 2017-11-02 (×2): 40 mg via ORAL
  Filled 2017-10-31 (×5): qty 2

## 2017-10-31 MED ORDER — FLUOXETINE HCL 20 MG PO CAPS
40.0000 mg | ORAL_CAPSULE | Freq: Every day | ORAL | Status: DC
  Administered 2017-10-31: 40 mg via ORAL
  Filled 2017-10-31: qty 2

## 2017-10-31 MED ORDER — ACETAMINOPHEN 325 MG PO TABS: 650.0000 mg | ORAL_TABLET | Freq: Four times a day (QID) | ORAL | Status: DC | PRN

## 2017-10-31 MED ORDER — POTASSIUM CHLORIDE CRYS ER 20 MEQ PO TBCR
60.0000 meq | EXTENDED_RELEASE_TABLET | Freq: Once | ORAL | Status: AC
  Administered 2017-10-31: 60 meq via ORAL
  Filled 2017-10-31: qty 3

## 2017-10-31 NOTE — Discharge Summary (Signed)
Physician Discharge Summary  Angela Romero [id number] DOB: 1976/06/01 DOA: 10/29/2017  PCP: System, Pcp Not In  Admit date: 10/29/2017 Discharge date: 10/31/2017  Admitted From: Home Disposition: [facility]  Recommendations for Outpatient Follow-up:  1. Follow up with provider at behavioral health Hospital at earliest convenience 2. With follow-up with primary care provider in 1-2 weeks   Home Health: No Equipment/Devices: None  Discharge Condition: Stable CODE STATUS: Full Diet recommendation: Heart Healthy   Brief/Interim Summary: 41 year old female with history of anemia and depression presented with aspirin overdose. Poison control was contacted. Patient was given few doses of charcoal and started on IV fluids. Serial salicylate levels were monitored. Psychiatry consult was called.  Psychiatry recommended inpatient behavioral health hospital admission.  Poison control has signed off.  Patient is medically stable for discharge.   Discharge Diagnoses:  Principal Problem:   Adjustment disorder with mixed disturbance of emotions and conduct Active Problems:   Salicylate overdose   Suicidal behavior   1. Intentional salicylate overdose- status post few doses of charcoal and treatment with intravenous fluids. Serial salicylate levels and CMP were monitored and are normal. Poison control has signed off. Discontinue IV fluids.  Psychiatry recommended  inpatient behavioral health hospital admission. Patient is medically stable for discharge. Discontinue Aspirin on discharge. Mental status stable.  2. Severe Depression with suicidal ideation -continue bedside sitter.  psychiatry evaluation appreciated. Transfer to the behavioral health Hospital 3. Chronic anemia-hemoglobin stable; out patient follow-up 4. Hypokalemia- replaced.  5. Previous history of mushroom poisoning.    Discharge Instructions  Discharge Instructions    Call MD for:  difficulty  breathing, headache or visual disturbances   Complete by:  As directed    Call MD for:  extreme fatigue   Complete by:  As directed    Call MD for:  hives   Complete by:  As directed    Call MD for:  persistant dizziness or light-headedness   Complete by:  As directed    Call MD for:  persistant nausea and vomiting   Complete by:  As directed    Call MD for:  severe uncontrolled pain   Complete by:  As directed    Call MD for:  temperature >100.4   Complete by:  As directed    Diet - low sodium heart healthy   Complete by:  As directed    Increase activity slowly   Complete by:  As directed      Allergies as of 10/31/2017   No Known Allergies     Medication List    STOP taking these medications   aspirin 325 MG tablet     TAKE these medications   FLUoxetine 40 MG capsule Commonly known as:  PROZAC Take 1 capsule by mouth daily.        No Known Allergies  Consultations: Psychiatriy  Procedures/Studies: None   Subjective: Patient seen and examined at bedside. She is sleepy but wakes up on calling her name and answers questions. She states that her ringing in her ears is improving. No overnight fever or vomiting    Discharge Exam: Vitals:   10/31/17 1000 10/31/17 1036  BP: 124/66 (!) 122/54  Pulse:  88  Resp: 18 18  Temp:  98.2 F (36.8 C)  SpO2:  100%   Vitals:   10/31/17 0800 10/31/17 0900 10/31/17 1000 10/31/17 1036  BP: 113/65 123/75 124/66 (!) 122/54  Pulse: 83   88  Resp: 18 18 18 18   Temp:  98.8 F (37.1 C)   98.2 F (36.8 C)  TempSrc: Oral   Oral  SpO2: 98%   100%  Weight:      Height:        General exam: Appears calm and comfortable  Respiratory system: Bilateral decreased breath sound at bases Cardiovascular system: S1 & S2 heard, rate controlled  Gastrointestinal system: Abdomen is nondistended, soft and nontender. Normal bowel sounds heard. Extremities: No cyanosis, clubbing, edema       The results of significant  diagnostics from this hospitalization (including imaging, microbiology, ancillary and laboratory) are listed below for reference.     Microbiology: Recent Results (from the past 240 hour(s))  MRSA PCR Screening     Status: None   Collection Time: 10/30/17  8:11 AM  Result Value Ref Range Status   MRSA by PCR NEGATIVE NEGATIVE Final    Comment:        The GeneXpert MRSA Assay (FDA approved for NASAL specimens only), is one component of a comprehensive MRSA colonization surveillance program. It is not intended to diagnose MRSA infection nor to guide or monitor treatment for MRSA infections.      Labs: BNP (last 3 results) No results for input(s): BNP in the last 8760 hours. Basic Metabolic Panel: Recent Labs  Lab 10/29/17 2340  10/30/17 0830  10/30/17 1448 10/30/17 1739 10/30/17 2045 10/30/17 2359 10/31/17 0401  NA  --    < > 140   < > 139 138 139 138 140  K  --    < > 3.5   < > 3.7 3.8 3.5 3.4* 3.3*  CL  --    < > 110   < > 111 111 112* 111 112*  CO2  --    < > 22   < > 21* 21* 20* 20* 21*  GLUCOSE  --    < > 100*   < > 99 96 91 87 85  BUN  --    < > 8   < > 7 6 6 6 6   CREATININE  --    < > 0.55   < > 0.55 0.59 0.62 0.55 0.57  CALCIUM  --    < > 8.1*   < > 7.9* 7.9* 8.0* 8.0* 8.1*  MG 2.3  --  2.0  --   --   --   --   --   --    < > = values in this interval not displayed.   Liver Function Tests: Recent Labs  Lab 10/30/17 1448 10/30/17 1739 10/30/17 2045 10/30/17 2359 10/31/17 0401  AST 20 19 17 19 18   ALT 19 17 17 17 16   ALKPHOS 52 50 49 49 52  BILITOT 0.4 0.3 0.4 0.5 0.7  PROT 6.1* 5.6* 5.6* 5.8* 5.7*  ALBUMIN 3.2* 3.0* 3.0* 2.9* 3.0*   No results for input(s): LIPASE, AMYLASE in the last 168 hours. No results for input(s): AMMONIA in the last 168 hours. CBC: Recent Labs  Lab 10/29/17 2310 10/30/17 0605 10/31/17 0401  WBC 13.8* 10.2 8.4  NEUTROABS  --   --  4.9  HGB 9.6* 8.8* 8.3*  HCT 31.7* 29.2* 27.9*  MCV 72.0* 72.5* 73.0*  PLT 451* 413*  379   Cardiac Enzymes: No results for input(s): CKTOTAL, CKMB, CKMBINDEX, TROPONINI in the last 168 hours. BNP: Invalid input(s): POCBNP CBG: Recent Labs  Lab 10/30/17 1129 10/30/17 1756 10/30/17 2310 10/31/17 0545 10/31/17 1210  GLUCAP 112* 97 88 74 92  D-Dimer No results for input(s): DDIMER in the last 72 hours. Hgb A1c No results for input(s): HGBA1C in the last 72 hours. Lipid Profile No results for input(s): CHOL, HDL, LDLCALC, TRIG, CHOLHDL, LDLDIRECT in the last 72 hours. Thyroid function studies No results for input(s): TSH, T4TOTAL, T3FREE, THYROIDAB in the last 72 hours.  Invalid input(s): FREET3 Anemia work up No results for input(s): VITAMINB12, FOLATE, FERRITIN, TIBC, IRON, RETICCTPCT in the last 72 hours. Urinalysis    Component Value Date/Time   COLORURINE COLORLESS (A) 10/29/2017 2329   APPEARANCEUR CLEAR 10/29/2017 2329   LABSPEC 1.003 (L) 10/29/2017 2329   PHURINE 6.0 10/29/2017 2329   GLUCOSEU NEGATIVE 10/29/2017 2329   HGBUR NEGATIVE 10/29/2017 2329   BILIRUBINUR NEGATIVE 10/29/2017 2329   KETONESUR NEGATIVE 10/29/2017 2329   PROTEINUR NEGATIVE 10/29/2017 2329   UROBILINOGEN 0.2 09/25/2014 2312   NITRITE NEGATIVE 10/29/2017 2329   LEUKOCYTESUR NEGATIVE 10/29/2017 2329   Sepsis Labs Invalid input(s): PROCALCITONIN,  WBC,  LACTICIDVEN Microbiology Recent Results (from the past 240 hour(s))  MRSA PCR Screening     Status: None   Collection Time: 10/30/17  8:11 AM  Result Value Ref Range Status   MRSA by PCR NEGATIVE NEGATIVE Final    Comment:        The GeneXpert MRSA Assay (FDA approved for NASAL specimens only), is one component of a comprehensive MRSA colonization surveillance program. It is not intended to diagnose MRSA infection nor to guide or monitor treatment for MRSA infections.      Time coordinating discharge: 35 minutes  SIGNED:   Aline August, MD  [facility] 10/31/2017, 1:20 PM Pager: [phone]  If  7PM-7AM, please contact night-coverage [email or web address] Password [email or web address]

## 2017-10-31 NOTE — Progress Notes (Signed)
Patient belonging bag given to [facility] transport driver cell phone and  Clothing in bag. Patient discharged to [facility]

## 2017-10-31 NOTE — Tx Team (Signed)
Initial Treatment Plan 10/31/2017 6:47 PM Angela Romero [id number]    PATIENT STRESSORS: Marital or family conflict   PATIENT STRENGTHS: Ability for insight Active sense of humor Average or above average intelligence Capable of independent living General fund of knowledge Motivation for treatment/growth   PATIENT IDENTIFIED PROBLEMS: Depression Suicidal thoughts "I cheated on my husband 4 months ago"                     DISCHARGE CRITERIA:  Ability to meet basic life and health needs Improved stabilization in mood, thinking, and/or behavior Reduction of life-threatening or endangering symptoms to within safe limits Verbal commitment to aftercare and medication compliance  PRELIMINARY DISCHARGE PLAN: Attend aftercare/continuing care group Return to previous living arrangement  PATIENT/FAMILY INVOLVEMENT: This treatment plan has been presented to and reviewed with the patient, Angela Romero, and/or family member, .  The patient and family have been given the opportunity to ask questions and make suggestions.  [place], [place], [place] 10/31/2017, 6:47 PM

## 2017-10-31 NOTE — Progress Notes (Signed)
Patient is transported from ICU to [id number]. Patient is AOX4 with family and sitter at the bedside.

## 2017-10-31 NOTE — Progress Notes (Signed)
Patient ID: Angela Romero, female   DOB: 1976/04/10, 41 y.o.   MRN: [id number]  PROGRESS NOTE    Angela Romero  [id number] DOB: 04-19-1976 DOA: 10/29/2017 PCP: System, Pcp Not In   Brief Narrative:  41 year old female with history of anemia and depression presented with aspirin overdose. Poison control was contacted. Patient was given few doses of charcoal and started on IV fluids. Serial salicylate levels were monitored. Psychiatry consult was called.  Assessment & Plan:   Principal Problem:   Adjustment disorder with mixed disturbance of emotions and conduct Active Problems:   Salicylate overdose   Suicidal behavior    1. Intentional salicylate overdose - status post few doses of charcoal and treatment with intravenous fluids. Serial salicylate levels were monitored and are normal. Poison control has signed off. Discontinue IV fluids. Transfer patient to medical surgical floor. Start regular diet.  2. Severe Depression with suicidal ideation  -continue bedside sitter.   psychiatry evaluation appreciated. Transfer to the behavioral health Hospital was bed is available. Social worker consult  3. Chronic anemia -hemoglobin stable; out patient follow-up 4. Hypokalemia- replace. Repeat a.m. labs 5. Previous history of mushroom poisoning.   DVT prophylaxis: SCDs Code Status:  Full Family Communication: None at bedside Disposition Plan: [facility] once bed is available  Consultants: Psychiatry  Procedures: None  Antimicrobials: None    Subjective: Patient seen and examined at bedside. She is sleepy but wakes up on calling her name and answers questions. She states that her ringing in her ears is improving. No overnight fever or vomiting.  Objective: Vitals:   10/31/17 0300 10/31/17 0400 10/31/17 0500 10/31/17 0600  BP: (!) 104/47 109/72 112/65 106/69  Pulse: 81 80 80 77  Resp: 18 11 16 18   Temp:  98.7 F (37.1 C)    TempSrc:  Oral    SpO2: 100%  98% 100% 98%  Weight:      Height:        Intake/Output Summary (Last 24 hours) at 10/31/2017 1601 Last data filed at 10/31/2017 0700 Gross per 24 hour  Intake 5239.17 ml  Output 5525 ml  Net -285.83 ml   Filed Weights   10/30/17 0800  Weight: 62.7 kg (138 lb 3.7 oz)    Examination:  General exam: Appears calm and comfortable  Respiratory system: Bilateral decreased breath sound at bases Cardiovascular system: S1 & S2 heard, rate controlled  Gastrointestinal system: Abdomen is nondistended, soft and nontender. Normal bowel sounds heard. Extremities: No cyanosis, clubbing, edema    Data Reviewed: I have personally reviewed following labs and imaging studies  CBC: Recent Labs  Lab 10/29/17 2310 10/30/17 0605 10/31/17 0401  WBC 13.8* 10.2 8.4  NEUTROABS  --   --  4.9  HGB 9.6* 8.8* 8.3*  HCT 31.7* 29.2* 27.9*  MCV 72.0* 72.5* 73.0*  PLT 451* 413* 093   Basic Metabolic Panel: Recent Labs  Lab 10/29/17 2340  10/30/17 0830  10/30/17 1448 10/30/17 1739 10/30/17 2045 10/30/17 2359 10/31/17 0401  NA  --    < > 140   < > 139 138 139 138 140  K  --    < > 3.5   < > 3.7 3.8 3.5 3.4* 3.3*  CL  --    < > 110   < > 111 111 112* 111 112*  CO2  --    < > 22   < > 21* 21* 20* 20* 21*  GLUCOSE  --    < >  100*   < > 99 96 91 87 85  BUN  --    < > 8   < > 7 6 6 6 6   CREATININE  --    < > 0.55   < > 0.55 0.59 0.62 0.55 0.57  CALCIUM  --    < > 8.1*   < > 7.9* 7.9* 8.0* 8.0* 8.1*  MG 2.3  --  2.0  --   --   --   --   --   --    < > = values in this interval not displayed.   GFR: Estimated Creatinine Clearance: 82.1 mL/min (by C-G formula based on SCr of 0.57 mg/dL). Liver Function Tests: Recent Labs  Lab 10/30/17 1448 10/30/17 1739 10/30/17 2045 10/30/17 2359 10/31/17 0401  AST 20 19 17 19 18   ALT 19 17 17 17 16   ALKPHOS 52 50 49 49 52  BILITOT 0.4 0.3 0.4 0.5 0.7  PROT 6.1* 5.6* 5.6* 5.8* 5.7*  ALBUMIN 3.2* 3.0* 3.0* 2.9* 3.0*   No results for input(s):  LIPASE, AMYLASE in the last 168 hours. No results for input(s): AMMONIA in the last 168 hours. Coagulation Profile: Recent Labs  Lab 10/29/17 2340 10/31/17 0401  INR 0.95 1.18   Cardiac Enzymes: No results for input(s): CKTOTAL, CKMB, CKMBINDEX, TROPONINI in the last 168 hours. BNP (last 3 results) No results for input(s): PROBNP in the last 8760 hours. HbA1C: No results for input(s): HGBA1C in the last 72 hours. CBG: Recent Labs  Lab 10/30/17 0618 10/30/17 1129 10/30/17 1756 10/30/17 2310 10/31/17 0545  GLUCAP 110* 112* 97 88 74   Lipid Profile: No results for input(s): CHOL, HDL, LDLCALC, TRIG, CHOLHDL, LDLDIRECT in the last 72 hours. Thyroid Function Tests: No results for input(s): TSH, T4TOTAL, FREET4, T3FREE, THYROIDAB in the last 72 hours. Anemia Panel: No results for input(s): VITAMINB12, FOLATE, FERRITIN, TIBC, IRON, RETICCTPCT in the last 72 hours. Sepsis Labs: No results for input(s): PROCALCITON, LATICACIDVEN in the last 168 hours.  Recent Results (from the past 240 hour(s))  MRSA PCR Screening     Status: None   Collection Time: 10/30/17  8:11 AM  Result Value Ref Range Status   MRSA by PCR NEGATIVE NEGATIVE Final    Comment:        The GeneXpert MRSA Assay (FDA approved for NASAL specimens only), is one component of a comprehensive MRSA colonization surveillance program. It is not intended to diagnose MRSA infection nor to guide or monitor treatment for MRSA infections.          Radiology Studies: No results found.      Scheduled Meds: . FLUoxetine  40 mg Oral Daily  . Influenza vac split quadrivalent PF  0.5 mL Intramuscular Tomorrow-1000   Continuous Infusions:   LOS: 1 day        Aline August, MD [facility] Pager [phone]  If 7PM-7AM, please contact night-coverage [email or web address] Password [facility] 10/31/2017, 8:33 AM

## 2017-10-31 NOTE — Progress Notes (Signed)
Patient ID: Angela Romero, female   DOB: 1976/08/11, 41 y.o.   MRN: [id number]  Pt currently presents with a blunted affect and guarded behavior. Pt is isolative, remains in her bed tonight. Pt reports to writer that their goal is to "leave soon." Pt states "I have kids at the house that don't even know I am here, I need to get back to them." Pt reports poor sleep with current medication regimen, requests medication. Reports on going hearing difficulty in both her R and L ears, partly due to tinnitus.   Pt provided with medications per providers orders. Pt's labs and vitals were monitored throughout the night. Pt given a 1:1 about emotional and mental status. Pt supported and encouraged to express concerns and questions. Pt educated on as needed medication and rules and regulations of the unit.   Pt's safety ensured with 15 minute and environmental checks. Pt currently denies SI/HI and A/V hallucinations. Pt verbally agrees to seek staff if SI/HI or A/VH occurs and to consult with staff before acting on any harmful thoughts. Reports that she "might" need a new therapist post discharge. Will continue POC.

## 2017-10-31 NOTE — Progress Notes (Signed)
Pt did not attend evening wrap up group.

## 2017-10-31 NOTE — Progress Notes (Signed)
Ardis is a 41 year old female pt admitted on voluntary basis. She reports that she got into an argument with her husband and did take overdose with intention of killing herself. She reports that she is having trouble inside the marriage and reports that she cheated on her husband 4 months ago. She reports that she has been going to counseling and reports that she is taking prozac but reports that nothing is helping. She reports that her husband is mean to her and reports that she wants help so she does not think of other men. She denies any SI on admission and is able to contract for safety on the unit. Daniqua was oriented to the unit and safety maintained.
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
## 2017-11-01 DIAGNOSIS — T1491XA Suicide attempt, initial encounter: Secondary | ICD-10-CM

## 2017-11-01 DIAGNOSIS — F332 Major depressive disorder, recurrent severe without psychotic features: Principal | ICD-10-CM

## 2017-11-01 DIAGNOSIS — Z818 Family history of other mental and behavioral disorders: Secondary | ICD-10-CM

## 2017-11-01 DIAGNOSIS — R4689 Other symptoms and signs involving appearance and behavior: Secondary | ICD-10-CM

## 2017-11-01 DIAGNOSIS — T39012A Poisoning by aspirin, intentional self-harm, initial encounter: Secondary | ICD-10-CM

## 2017-11-01 DIAGNOSIS — Z6379 Other stressful life events affecting family and household: Secondary | ICD-10-CM

## 2017-11-01 LAB — HEMOGLOBIN A1C
Hgb A1c MFr Bld: 5.7 % — ABNORMAL HIGH (ref 4.8–5.6)
Mean Plasma Glucose: 116.89 mg/dL

## 2017-11-01 LAB — LIPID PANEL
CHOLESTEROL: 170 mg/dL (ref 0–200)
HDL: 56 mg/dL (ref >40)
LDL CALC: 88 mg/dL (ref 0–99)
TRIGLYCERIDES: 128 mg/dL (ref <150)
Total CHOL/HDL Ratio: 3 RATIO
VLDL: 26 mg/dL (ref 0–40)

## 2017-11-01 LAB — TSH: TSH: 3.808 u[IU]/mL (ref 0.350–4.500)

## 2017-11-01 NOTE — BHH Suicide Risk Assessment (Signed)
[facility] Admission Suicide Risk Assessment   Nursing information obtained from:    Demographic factors:    Current Mental Status:    Loss Factors:    Historical Factors:    Risk Reduction Factors:     Total Time spent with patient: 30 minutes Principal Problem: <principal problem not specified> Diagnosis:   Patient Active Problem List   Diagnosis Date Noted  . Severe recurrent major depression without psychotic features ([place]) [F33.2] 10/31/2017  . Salicylate overdose [T61.443X] 10/30/2017  . Suicidal behavior [R46.89] 10/30/2017  . Adjustment disorder with mixed disturbance of emotions and conduct [F43.25]   . Moderate episode of recurrent major depressive disorder ([place]) [F33.1] 02/09/2017  . 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 Background history of MDD recurrent. Presented to the ER in company of her father. Took thirty pills of Aspirin in an attempt to end her own life. Expressed suicidal intent at the ER. Reported main stressor as marital disharmony. She cheated on her husband months ago and was distressed about this as she felt she must have been under some influence. Patient was managed medically and transferred to our unit. Consult team managed her on the floor. Her husband gave some input to consult team.  Patient still has unresolved marital issues. She is still conflicted with respect to her relationship. Her kids are protective for her. She is cooperating with treatment, no associated psychosis or mania. No associated substance use. No past suicidal behavior, no family history of suicide.  No cognitive impairment. No access to weapons. She has agreed to communicate suicidal thoughts of with staff if the thoughts becomes overwhelming.    Continued Clinical Symptoms:  Alcohol Use Disorder Identification Test Final Score (AUDIT): 0 The "Alcohol Use Disorders  Identification Test", Guidelines for Use in Primary Care, Second Edition.  World Pharmacologist [facility]). Score between 0-7:  no or low risk or alcohol related problems. Score between 8-15:  moderate risk of alcohol related problems. Score between 16-19:  high risk of alcohol related problems. Score 20 or above:  warrants further diagnostic evaluation for alcohol dependence and treatment.   CLINICAL FACTORS:   Depression:   Severe   Musculoskeletal: Strength & Muscle Tone: within normal limits Gait & Station: normal Patient leans: N/A  Psychiatric Specialty Exam: Physical Exam  ROS  Blood pressure 121/69, pulse 93, temperature 98.4 F (36.9 C), temperature source Oral, resp. rate 18, height 5\' 3"  (1.6 m), weight 62.1 kg (137 lb).Body mass index is 24.27 kg/m.  General Appearance: As in H&P  Eye Contact:    Speech:    Volume:    Mood:    Affect:    Thought Process:    Orientation:  As in H&P  Thought Content:    Suicidal Thoughts:    Homicidal Thoughts:    Memory:    Judgement:    Insight:    Psychomotor Activity:    Concentration:  As in H&P  Recall:    Fund of Knowledge:    Language:    Akathisia:    Handed:    AIMS (if indicated):     Assets:    ADL's:    Cognition:  As in H&P  Sleep:  Number of Hours: 6      COGNITIVE FEATURES THAT CONTRIBUTE TO RISK:  None    SUICIDE RISK:   Moderate:  Frequent suicidal ideation with limited intensity, and duration, some  specificity in terms of plans, no associated intent, good self-control, limited dysphoria/symptomatology, some risk factors present, and identifiable protective factors, including available and accessible social support.  PLAN OF CARE:  As in H&P  I certify that inpatient services furnished can reasonably be expected to improve the patient's condition.   [place], MD 11/01/2017, 10:27 AM

## 2017-11-01 NOTE — Progress Notes (Signed)
Patient ID: Angela Romero, female   DOB: [birth year], 41 y.o.   MRN: [id number]  Pt currently presents with a flat affect and guarded behavior. Pt forwards little to Probation officer. Pt asks to keep her door open tonight to avoid the sound of the door opening. Pt then has concerns of safety, states "But I am safe right? No one will come in?" Pt reports good sleep with current medication regimen.   Pt provided with medications per providers orders. Pt's labs and vitals were monitored throughout the night. Pt given a 1:1 about emotional and mental status. Pt supported and encouraged to express concerns and questions. Pt educated on medications and unit safety measures.   Pt's safety ensured with 15 minute and environmental checks. Pt currently denies SI/HI and A/V hallucinations. Pt verbally agrees to seek staff if SI/HI or A/VH occurs and to consult with staff before acting on any harmful thoughts. Will continue POC.
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
## 2017-11-01 NOTE — Progress Notes (Signed)
Adult Psychoeducational Group Note  Date:  11/01/2017 Time:  10:47 PM  Group Topic/Focus:  Wrap-Up Group:   The focus of this group is to help patients review their daily goal of treatment and discuss progress on daily workbooks.  Participation Level:  Active  Participation Quality:  Attentive  Affect:  Appropriate  Cognitive:  Appropriate  Insight: Good  Engagement in Group:  Engaged  Modes of Intervention:  Discussion, Education and Support  Additional Comments:  Pt rated her day at 9/10. Pt's goal was to think positive and reported that she met her goal.  Rocky Crafts 11/01/2017, 10:47 PM

## 2017-11-01 NOTE — Progress Notes (Signed)
D: Patient struggles with hearing, especially since her aspirin overdose.  Patient was reluctant to take prozac this am, as she wants to take it at night.  Explained to patient that she could speak to the MD about changing it to night and she took the morning dose.  She denies any thoughts of self harm today.  She is focused on discharge and her goal is to "get back to my family."  Patient has tendency to isolate to her room.  She did attend group today and was an active participant.   A: Continue to monitor medication management and MD orders.  Safety checks completed every 15 minutes per protocol.  Offer support and encouragement as needed. R: Patient is somewhat isolative and minimal with staff.

## 2017-11-01 NOTE — H&P (Signed)
Psychiatric Admission Assessment Adult  Patient Identification: Angela Romero MRN:  [id number] Date of Evaluation:  11/01/2017 Chief Complaint:  Suicidal attempt Principal Diagnosis: MDD recurrent Diagnosis:   Patient Active Problem List   Diagnosis Date Noted  . Severe recurrent major depression without psychotic features ([place]) [F33.2] 10/31/2017  . Salicylate overdose [R60.454U] 10/30/2017  . Suicidal behavior [R46.89] 10/30/2017  . Adjustment disorder with mixed disturbance of emotions and conduct [F43.25]   . Moderate episode of recurrent major depressive disorder ([place]) [F33.1] 02/09/2017  . 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 Background history of MDD recurrent. Presented to the ER in company of her father. Took thirty pills of Aspirin in an attempt to end her own life. Expressed suicidal intent at the ER. Reported main stressor as marital disharmony. She cheated on her husband months ago and was distressed about this as she felt she must have been under some influence. Patient was managed medically and transferred to our unit. Consult team managed her on the floor. Her husband gave some input to consult team.   At interview, patient clarified that she presented in company of her husband. Says he is much older than she is an easily misidentified as her father. Patient reports having an affair four months ago. Says she has been attending family therapy with her husband to resolve the conflict. Patient reports that she still has strong feelings for the person she had an affair with but does not want to loose her family. Says her husband has become very clingy since the affair. He brings it up all the time. Patient reports they have been having a lot of argument. Recent overdose was precipitated by an argument. Says she went to the kitchen and took the pills.  Says at that time she acted impulsive though she had repeatedly threatened to harm self before. Patient did not plan this. Says medication had been in the cabinet for months. She did not do anything to conceal what she did. Her husband came down and found the pill bottle. Patient did not send any goodbye message. She tells me that she has been able to process the event. Says she loves her children and would not like to put them through such emotional pain. Says she hopes to go home soon and get back to her routines. Reports good response to Fluoxetine. She had been on it for years. She is sleeping normally. Her appetite is good. Says she has not had any changes in weight. Some ringing in the ear due to salicylate OD. Some difficulty with speed of processing information as she struggles to hear. No hallucination in any modality. No abnormal belief. No evidence of mania. No violent thoughts. No homicidal thoughts. No substance use. No access to weapons.     Total Time spent with patient: 1 hour  Past Psychiatric History: Long history of unipolar depression. Has responded well to Fluoxetine. No past mania. No past inpatient care. No past suicidal behavior. She is followed by Dr. Gwenlyn Perking. Her family therapist is Canary Brim.   Is the patient at risk to self? No.  Has the patient been a risk to self in the past 6 months? Yes.    Has the patient been a risk to self within the distant past? No.  Is the patient a risk to others? No.  Has the patient been a risk to others in the  past 6 months? No.  Has the patient been a risk to others within the distant past? No.   Prior Inpatient Therapy:   Prior Outpatient Therapy:    Alcohol Screening: 1. How often do you have a drink containing alcohol?: Never 2. How many drinks containing alcohol do you have on a typical day when you are drinking?: 1 or 2 3. How often do you have six or more drinks on one occasion?: Never AUDIT-C Score: 0 4. How often during  the last year have you found that you were not able to stop drinking once you had started?: Never 5. How often during the last year have you failed to do what was normally expected from you becasue of drinking?: Never 6. How often during the last year have you needed a first drink in the morning to get yourself going after a heavy drinking session?: Never 7. How often during the last year have you had a feeling of guilt of remorse after drinking?: Never 8. How often during the last year have you been unable to remember what happened the night before because you had been drinking?: Never 9. Have you or someone else been injured as a result of your drinking?: No 10. Has a relative or friend or a doctor or another health worker been concerned about your drinking or suggested you cut down?: No Alcohol Use Disorder Identification Test Final Score (AUDIT): 0 Intervention/Follow-up: AUDIT Score <7 follow-up not indicated Substance Abuse History in the last 12 months:  No. Consequences of Substance Abuse: NA Previous Psychotropic Medications: Yes  Psychological Evaluations: Yes  Past Medical History:  Past Medical History:  Diagnosis Date  . Anemia   . Depression   . Medical history non-contributory   . Memory difficulty   . Problems with hearing     Past Surgical History:  Procedure Laterality Date  . EYE SURGERY    . [place]    x 4 other 3 as a child  . REFRACTIVE SURGERY  07/2014   Family History:  Family History  Problem Relation Age of Onset  . Diabetes Mellitus II Mother   . Diabetes Mother   . Prostate cancer Father   . Colon cancer Neg Hx    Family Psychiatric  History: Mother had depression. No family history of addiction or suicide.  Tobacco Screening: Have you used any form of tobacco in the last 30 days? (Cigarettes, Smokeless Tobacco, Cigars, and/or Pipes): No Social History:  Social History   Substance and Sexual Activity  Alcohol Use No     Social  History   Substance and Sexual Activity  Drug Use No    Additional Social History:      Allergies:  No Known Allergies Lab Results:  Results for orders placed or performed during the hospital encounter of 10/29/17 (from the past 48 hour(s))  Glucose, capillary     Status: Abnormal   Collection Time: 10/30/17 11:29 AM  Result Value Ref Range   Glucose-Capillary 112 (H) 65 - 99 mg/dL  Salicylate level     Status: None   Collection Time: 10/30/17 11:55 AM  Result Value Ref Range   Salicylate Lvl 58.0 2.8 - 30.0 mg/dL  Comprehensive metabolic panel     Status: Abnormal   Collection Time: 10/30/17 11:55 AM  Result Value Ref Range   Sodium 141 135 - 145 mmol/L   Potassium 3.4 (L) 3.5 - 5.1 mmol/L   Chloride 113 (H) 101 - 111  mmol/L   CO2 20 (L) 22 - 32 mmol/L   Glucose, Bld 102 (H) 65 - 99 mg/dL   BUN 6 6 - 20 mg/dL   Creatinine, Ser 0.60 0.44 - 1.00 mg/dL   Calcium 7.9 (L) 8.9 - 10.3 mg/dL   Total Protein 6.1 (L) 6.5 - 8.1 g/dL   Albumin 3.1 (L) 3.5 - 5.0 g/dL   AST 19 15 - 41 U/L   ALT 18 14 - 54 U/L   Alkaline Phosphatase 51 38 - 126 U/L   Total Bilirubin 0.2 (L) 0.3 - 1.2 mg/dL   GFR calc non Af Amer >60 >60 mL/min   GFR calc Af Amer >60 >60 mL/min    Comment: (NOTE) The eGFR has been calculated using the CKD EPI equation. This calculation has not been validated in all clinical situations. eGFR's persistently <60 mL/min signify possible Chronic Kidney Disease.    Anion gap 8 5 - 15  Comprehensive metabolic panel     Status: Abnormal   Collection Time: 10/30/17  2:48 PM  Result Value Ref Range   Sodium 139 135 - 145 mmol/L   Potassium 3.7 3.5 - 5.1 mmol/L   Chloride 111 101 - 111 mmol/L   CO2 21 (L) 22 - 32 mmol/L   Glucose, Bld 99 65 - 99 mg/dL   BUN 7 6 - 20 mg/dL   Creatinine, Ser 0.55 0.44 - 1.00 mg/dL   Calcium 7.9 (L) 8.9 - 10.3 mg/dL   Total Protein 6.1 (L) 6.5 - 8.1 g/dL   Albumin 3.2 (L) 3.5 - 5.0 g/dL   AST 20 15 - 41 U/L   ALT 19 14 - 54 U/L    Alkaline Phosphatase 52 38 - 126 U/L   Total Bilirubin 0.4 0.3 - 1.2 mg/dL   GFR calc non Af Amer >60 >60 mL/min   GFR calc Af Amer >60 >60 mL/min    Comment: (NOTE) The eGFR has been calculated using the CKD EPI equation. This calculation has not been validated in all clinical situations. eGFR's persistently <60 mL/min signify possible Chronic Kidney Disease.    Anion gap 7 5 - 15  Comprehensive metabolic panel     Status: Abnormal   Collection Time: 10/30/17  5:39 PM  Result Value Ref Range   Sodium 138 135 - 145 mmol/L   Potassium 3.8 3.5 - 5.1 mmol/L   Chloride 111 101 - 111 mmol/L   CO2 21 (L) 22 - 32 mmol/L   Glucose, Bld 96 65 - 99 mg/dL   BUN 6 6 - 20 mg/dL   Creatinine, Ser 0.59 0.44 - 1.00 mg/dL   Calcium 7.9 (L) 8.9 - 10.3 mg/dL   Total Protein 5.6 (L) 6.5 - 8.1 g/dL   Albumin 3.0 (L) 3.5 - 5.0 g/dL   AST 19 15 - 41 U/L   ALT 17 14 - 54 U/L   Alkaline Phosphatase 50 38 - 126 U/L   Total Bilirubin 0.3 0.3 - 1.2 mg/dL   GFR calc non Af Amer >60 >60 mL/min   GFR calc Af Amer >60 >60 mL/min    Comment: (NOTE) The eGFR has been calculated using the CKD EPI equation. This calculation has not been validated in all clinical situations. eGFR's persistently <60 mL/min signify possible Chronic Kidney Disease.    Anion gap 6 5 - 15  Salicylate level     Status: None   Collection Time: 10/30/17  5:39 PM  Result Value Ref Range   Salicylate  Lvl 19.7 2.8 - 30.0 mg/dL  Glucose, capillary     Status: None   Collection Time: 10/30/17  5:56 PM  Result Value Ref Range   Glucose-Capillary 97 65 - 99 mg/dL   Comment 1 Notify RN    Comment 2 Document in Chart   Comprehensive metabolic panel     Status: Abnormal   Collection Time: 10/30/17  8:45 PM  Result Value Ref Range   Sodium 139 135 - 145 mmol/L   Potassium 3.5 3.5 - 5.1 mmol/L   Chloride 112 (H) 101 - 111 mmol/L   CO2 20 (L) 22 - 32 mmol/L   Glucose, Bld 91 65 - 99 mg/dL   BUN 6 6 - 20 mg/dL   Creatinine, Ser 0.62  0.44 - 1.00 mg/dL   Calcium 8.0 (L) 8.9 - 10.3 mg/dL   Total Protein 5.6 (L) 6.5 - 8.1 g/dL   Albumin 3.0 (L) 3.5 - 5.0 g/dL   AST 17 15 - 41 U/L   ALT 17 14 - 54 U/L   Alkaline Phosphatase 49 38 - 126 U/L   Total Bilirubin 0.4 0.3 - 1.2 mg/dL   GFR calc non Af Amer >60 >60 mL/min   GFR calc Af Amer >60 >60 mL/min    Comment: (NOTE) The eGFR has been calculated using the CKD EPI equation. This calculation has not been validated in all clinical situations. eGFR's persistently <60 mL/min signify possible Chronic Kidney Disease.    Anion gap 7 5 - 15  Salicylate level     Status: None   Collection Time: 10/30/17  8:45 PM  Result Value Ref Range   Salicylate Lvl 05.6 2.8 - 30.0 mg/dL  Glucose, capillary     Status: None   Collection Time: 10/30/17 11:10 PM  Result Value Ref Range   Glucose-Capillary 88 65 - 99 mg/dL  Comprehensive metabolic panel     Status: Abnormal   Collection Time: 10/30/17 11:59 PM  Result Value Ref Range   Sodium 138 135 - 145 mmol/L   Potassium 3.4 (L) 3.5 - 5.1 mmol/L   Chloride 111 101 - 111 mmol/L   CO2 20 (L) 22 - 32 mmol/L   Glucose, Bld 87 65 - 99 mg/dL   BUN 6 6 - 20 mg/dL   Creatinine, Ser 0.55 0.44 - 1.00 mg/dL   Calcium 8.0 (L) 8.9 - 10.3 mg/dL   Total Protein 5.8 (L) 6.5 - 8.1 g/dL   Albumin 2.9 (L) 3.5 - 5.0 g/dL   AST 19 15 - 41 U/L   ALT 17 14 - 54 U/L   Alkaline Phosphatase 49 38 - 126 U/L   Total Bilirubin 0.5 0.3 - 1.2 mg/dL   GFR calc non Af Amer >60 >60 mL/min   GFR calc Af Amer >60 >60 mL/min    Comment: (NOTE) The eGFR has been calculated using the CKD EPI equation. This calculation has not been validated in all clinical situations. eGFR's persistently <60 mL/min signify possible Chronic Kidney Disease.    Anion gap 7 5 - 15  Salicylate level     Status: None   Collection Time: 10/30/17 11:59 PM  Result Value Ref Range   Salicylate Lvl 97.9 2.8 - 30.0 mg/dL  Comprehensive metabolic panel     Status: Abnormal   Collection  Time: 10/31/17  4:01 AM  Result Value Ref Range   Sodium 140 135 - 145 mmol/L   Potassium 3.3 (L) 3.5 - 5.1 mmol/L   Chloride 112 (  H) 101 - 111 mmol/L   CO2 21 (L) 22 - 32 mmol/L   Glucose, Bld 85 65 - 99 mg/dL   BUN 6 6 - 20 mg/dL   Creatinine, Ser 0.57 0.44 - 1.00 mg/dL   Calcium 8.1 (L) 8.9 - 10.3 mg/dL   Total Protein 5.7 (L) 6.5 - 8.1 g/dL   Albumin 3.0 (L) 3.5 - 5.0 g/dL   AST 18 15 - 41 U/L   ALT 16 14 - 54 U/L   Alkaline Phosphatase 52 38 - 126 U/L   Total Bilirubin 0.7 0.3 - 1.2 mg/dL   GFR calc non Af Amer >60 >60 mL/min   GFR calc Af Amer >60 >60 mL/min    Comment: (NOTE) The eGFR has been calculated using the CKD EPI equation. This calculation has not been validated in all clinical situations. eGFR's persistently <60 mL/min signify possible Chronic Kidney Disease.    Anion gap 7 5 - 15  CBC with Differential/Platelet     Status: Abnormal   Collection Time: 10/31/17  4:01 AM  Result Value Ref Range   WBC 8.4 4.0 - 10.5 K/uL   RBC 3.82 (L) 3.87 - 5.11 MIL/uL   Hemoglobin 8.3 (L) 12.0 - 15.0 g/dL   HCT 27.9 (L) 36.0 - 46.0 %   MCV 73.0 (L) 78.0 - 100.0 fL   MCH 21.7 (L) 26.0 - 34.0 pg   MCHC 29.7 (L) 30.0 - 36.0 g/dL   RDW 17.8 (H) 11.5 - 15.5 %   Platelets 379 150 - 400 K/uL   Neutrophils Relative % 58 %   Neutro Abs 4.9 1.7 - 7.7 K/uL   Lymphocytes Relative 33 %   Lymphs Abs 2.8 0.7 - 4.0 K/uL   Monocytes Relative 5 %   Monocytes Absolute 0.4 0.1 - 1.0 K/uL   Eosinophils Relative 3 %   Eosinophils Absolute 0.2 0.0 - 0.7 K/uL   Basophils Relative 1 %   Basophils Absolute 0.0 0.0 - 0.1 K/uL  Protime-INR     Status: None   Collection Time: 10/31/17  4:01 AM  Result Value Ref Range   Prothrombin Time 14.9 11.4 - 15.2 seconds   INR 6.31   Salicylate level     Status: None   Collection Time: 10/31/17  4:01 AM  Result Value Ref Range   Salicylate Lvl 49.7 2.8 - 30.0 mg/dL  Glucose, capillary     Status: None   Collection Time: 10/31/17  5:45 AM  Result  Value Ref Range   Glucose-Capillary 74 65 - 99 mg/dL  Glucose, capillary     Status: None   Collection Time: 10/31/17 12:10 PM  Result Value Ref Range   Glucose-Capillary 92 65 - 99 mg/dL   Comment 1 Notify RN    Comment 2 Document in Chart     Blood Alcohol level:  Lab Results  Component Value Date   ETH <10 10/29/2017   ETH <11 02/63/7858    Metabolic Disorder Labs:  No results found for: HGBA1C, MPG No results found for: PROLACTIN No results found for: CHOL, TRIG, HDL, CHOLHDL, VLDL, LDLCALC  Current Medications: Current Facility-Administered Medications  Medication Dose Route Frequency Provider Last Rate Last Dose  . acetaminophen (TYLENOL) tablet 650 mg  650 mg Oral Q6H PRN Lindon Romp A, NP      . alum & mag hydroxide-simeth (MAALOX/MYLANTA) 200-200-20 MG/5ML suspension 30 mL  30 mL Oral Q4H PRN Rozetta Nunnery, NP      . FLUoxetine (  PROZAC) capsule 40 mg  40 mg Oral Daily Lindon Romp A, NP      . hydrOXYzine (ATARAX/VISTARIL) tablet 25 mg  25 mg Oral TID PRN Rozetta Nunnery, NP   25 mg at 10/31/17 2133  . magnesium hydroxide (MILK OF MAGNESIA) suspension 30 mL  30 mL Oral Daily PRN Lindon Romp A, NP      . traZODone (DESYREL) tablet 50 mg  50 mg Oral QHS PRN Rozetta Nunnery, NP       PTA Medications: Medications Prior to Admission  Medication Sig Dispense Refill Last Dose  . FLUoxetine (PROZAC) 40 MG capsule Take 1 capsule by mouth daily.   10/29/2017 at Unknown time    Musculoskeletal: Strength & Muscle Tone: within normal limits Gait & Station: normal Patient leans: N/A  Psychiatric Specialty Exam: Physical Exam  ROS  Blood pressure 127/74, pulse 92, temperature 98.4 F (36.9 C), temperature source Oral, resp. rate 18, height _0  (1.6 m), weight 62.1 kg (137 lb).Body mass index is 24.27 kg/m.  General Appearance: Casually dressed, slightly withdrawn. Cooperative and appropriate.  Eye Contact:  Good  Speech:  Decrease tone and rate  Volume:  Normal  Mood:   Depressed  Affect:  Congruent and Restricted  Thought Process:  Linear  Orientation:  Full (Time, Place, and Person)  Thought Content: Ruminations about her relationship. No violent thoughts. No hallucination in any modality.   Suicidal Thoughts:  No  Homicidal Thoughts:  No  Memory:  Immediate;   Good Recent;   Good Remote;   Good  Judgement:  Fair  Insight:  Good  Psychomotor Activity:  Decreased  Concentration:  Concentration: Fair and Attention Span: Fair  Recall:  Good  Fund of Knowledge:  Good  Language:  Good  Akathisia:  Negative  Handed:    AIMS (if indicated):     Assets:  Desire for Improvement Housing Intimacy Physical Health Resilience  ADL's:  Intact  Cognition:  WNL  Sleep:  Number of Hours: 6    Treatment Plan Summary: Patient has family and personal history of depression. She presented in context of near lethal OD. She has been depressed recently. Recent episode was precipitated by marital disharmony. She is working on the root cause with her husband in therapy. Patient has ambivalence about who she really wants to be with.  We have agreed to continue Fluoxetine at current dose.   Psychiatric: MDD Recurrent  Medical:  Psychosocial:  Marital conflict  PLAN: 1. Continue Fluoxetine 40 mg daily 2. Encourage unit groups and activities 3. Monitor mood, behavior and interaction with peers 4. SW would gather collateral from her husband 5. SW would coordinate aftercare incorporating resumption of therapy.    Observation Level/Precautions:  15 minute checks  Laboratory:    Psychotherapy:    Medications:    Consultations:    Discharge Concerns:    Estimated LOS:  Other:     Physician Treatment Plan for Primary Diagnosis: <principal problem not specified> Long Term Goal(s): Improvement in symptoms so as ready for discharge  Short Term Goals: Ability to identify changes in lifestyle to reduce recurrence of condition will improve, Ability to verbalize  feelings will improve, Ability to disclose and discuss suicidal ideas, Ability to demonstrate self-control will improve, Ability to identify and develop effective coping behaviors will improve, Ability to maintain clinical measurements within normal limits will improve and Compliance with prescribed medications will improve  Physician Treatment Plan for Secondary Diagnosis: Active Problems:   Severe  recurrent major depression without psychotic features ([place])  Long Term Goal(s): Improvement in symptoms so as ready for discharge  Short Term Goals: Ability to identify changes in lifestyle to reduce recurrence of condition will improve, Ability to verbalize feelings will improve, Ability to disclose and discuss suicidal ideas, Ability to demonstrate self-control will improve, Ability to identify and develop effective coping behaviors will improve, Ability to maintain clinical measurements within normal limits will improve and Compliance with prescribed medications will improve  I certify that inpatient services furnished can reasonably be expected to improve the patient's condition.    [place], MD 11/11/20189:39 AM

## 2017-11-01 NOTE — [facility] Group Notes (Signed)
[facility] LCSW Group Therapy Note  Date/Time:  11/01/2017 10:00-11:00AM  Type of Therapy and Topic:  Group Therapy:  Healthy and Unhealthy Supports  Participation Level:  Active   Description of Group:  Patients in this group were introduced to the idea of adding a variety of healthy supports to address the various needs in their lives. The picture on the front of Sunday's workbook was used to demonstrate why more supports are needed in every patient's life.  Patients identified and described healthy supports versus unhealthy supports in general, then gave examples of each in their own lives.   They discussed what additional healthy supports could be helpful in their recovery and wellness after discharge in order to prevent future hospitalizations.   An emphasis was placed on using counselor, doctor, therapy groups, 12-step groups, and problem-specific support groups to expand supports.  They also worked as a group on developing a specific plan for several patients to deal with unhealthy supports through [place], psychoeducation with loved ones, and even termination of relationships.   Therapeutic Goals:   1)  discuss importance of adding supports to stay well once out of the hospital  2)  compare healthy versus unhealthy supports and identify some examples of each  3)  generate ideas and descriptions of healthy supports that can be added  4)  offer mutual support about how to address unhealthy supports  5)  encourage active participation in and adherence to discharge plan    Summary of Patient Progress:  The patient shared that the current healthy supports available in her life are her husband and children who are 6yo, 15yo, and 70yo,, while the current unhealthy supports are some of her friends.  The patient expressed a willingness to add services to help in her recovery journey.  She is still too new to be specific about what.   Therapeutic Modalities:   Motivational  Interviewing Brief Solution-Focused Therapy  Selmer Dominion, LCSW 11/01/2017, 11:00AM

## 2017-11-02 MED ORDER — FLUOXETINE HCL 20 MG PO CAPS
60.0000 mg | ORAL_CAPSULE | Freq: Every day | ORAL | Status: DC
  Administered 2017-11-03 – 2017-11-04 (×2): 60 mg via ORAL
  Filled 2017-11-02 (×5): qty 3

## 2017-11-02 NOTE — [facility] Group Notes (Signed)
LCSW Group Therapy Note   11/02/2017 1:15pm   Type of Therapy and Topic:  Group Therapy:  Overcoming Obstacles   Participation Level: Active   Description of Group:    In this group patients will be encouraged to explore what they see as obstacles to their own wellness and recovery. They will be guided to discuss their thoughts, feelings, and behaviors related to these obstacles. The group will process together ways to cope with barriers, with attention given to specific choices patients can make. Each patient will be challenged to identify changes they are motivated to make in order to overcome their obstacles. This group will be process-oriented, with patients participating in exploration of their own experiences as well as giving and receiving support and challenge from other group members.   Therapeutic Goals: 1. Patient will identify personal and current obstacles as they relate to admission. 2. Patient will identify barriers that currently interfere with their wellness or overcoming obstacles.  3. Patient will identify feelings, thought process and behaviors related to these barriers. 4. Patient will identify two changes they are willing to make to overcome these obstacles:      Therapeutic Modalities:   Cognitive Behavioral Therapy Solution Focused Therapy Motivational Interviewing Relapse Prevention Therapy  Georga Kaufmann, MSW, [facility] 11/02/2017 3:53 PM

## 2017-11-02 NOTE — Progress Notes (Signed)
Recreation Therapy Notes  Date: 11/02/17 Time: 0930 Location: [address] Dayroom  Group Topic: Stress Management  Goal Area(s) Addresses:  Patient will verbalize importance of using healthy stress management.  Patient will identify positive emotions associated with healthy stress management.   Intervention: Stress Management  Activity :  Meditation.  LRT introduced the stress management technique of meditation.  LRT played a meditation from the Calm app that allowed patients to focus on the things they are grateful for.  Education:  Stress Management, Discharge Planning.   Education Outcome: Acknowledges edcuation/In group clarification offered/Needs additional education  Clinical Observations/Feedback: Pt did not attend group.    Victorino Sparrow, LRT/CTRS         Victorino Sparrow A 11/02/2017 12:19 PM

## 2017-11-02 NOTE — Progress Notes (Signed)
Patient shared in group that she had a good day and that she was working on "thinking positive". Her goal for tomorrow is to get discharged if possible.

## 2017-11-02 NOTE — Tx Team (Signed)
Interdisciplinary Treatment and Diagnostic Plan Update 11/02/2017 Time of Session: 9:30am  Angela Romero  MRN: [id number]  Principal Diagnosis: Severe recurrent major depression without psychotic features [facility])  Secondary Diagnoses: Principal Problem:   Severe recurrent major depression without psychotic features ([place])   Current Medications:  Current Facility-Administered Medications  Medication Dose Route Frequency Provider Last Rate Last Dose  . acetaminophen (TYLENOL) tablet 650 mg  650 mg Oral Q6H PRN Lindon Romp A, NP      . alum & mag hydroxide-simeth (MAALOX/MYLANTA) 200-200-20 MG/5ML suspension 30 mL  30 mL Oral Q4H PRN Lindon Romp A, NP      . FLUoxetine (PROZAC) capsule 40 mg  40 mg Oral Daily Lindon Romp A, NP   40 mg at 11/02/17 0758  . hydrOXYzine (ATARAX/VISTARIL) tablet 25 mg  25 mg Oral TID PRN Rozetta Nunnery, NP   25 mg at 11/01/17 2306  . magnesium hydroxide (MILK OF MAGNESIA) suspension 30 mL  30 mL Oral Daily PRN Lindon Romp A, NP      . traZODone (DESYREL) tablet 50 mg  50 mg Oral QHS PRN Rozetta Nunnery, NP        PTA Medications: Medications Prior to Admission  Medication Sig Dispense Refill Last Dose  . FLUoxetine (PROZAC) 40 MG capsule Take 1 capsule by mouth daily.   10/29/2017 at Unknown time    Treatment Modalities: Medication Management, Group therapy, Case management,  1 to 1 session with clinician, Psychoeducation, Recreational therapy.  Patient Stressors: Marital or family conflict Patient Strengths: Ability for insight Active sense of humor Average or above average intelligence Capable of independent living General fund of knowledge Motivation for treatment/growth  Physician Treatment Plan for Primary Diagnosis: Severe recurrent major depression without psychotic features ([place]) Long Term Goal(s): Improvement in symptoms so as ready for discharge Short Term Goals: Ability to identify changes in lifestyle to reduce recurrence of  condition will improve Ability to verbalize feelings will improve Ability to disclose and discuss suicidal ideas Ability to demonstrate self-control will improve Ability to identify and develop effective coping behaviors will improve Ability to maintain clinical measurements within normal limits will improve Compliance with prescribed medications will improve Ability to identify changes in lifestyle to reduce recurrence of condition will improve Ability to verbalize feelings will improve Ability to disclose and discuss suicidal ideas Ability to demonstrate self-control will improve Ability to identify and develop effective coping behaviors will improve Ability to maintain clinical measurements within normal limits will improve Compliance with prescribed medications will improve  Medication Management: Evaluate patient's response, side effects, and tolerance of medication regimen.  Therapeutic Interventions: 1 to 1 sessions, Unit Group sessions and Medication administration.  Evaluation of Outcomes: Not Met  Physician Treatment Plan for Secondary Diagnosis: Principal Problem:   Severe recurrent major depression without psychotic features ([place])  Long Term Goal(s): Improvement in symptoms so as ready for discharge  Short Term Goals: Ability to identify changes in lifestyle to reduce recurrence of condition will improve Ability to verbalize feelings will improve Ability to disclose and discuss suicidal ideas Ability to demonstrate self-control will improve Ability to identify and develop effective coping behaviors will improve Ability to maintain clinical measurements within normal limits will improve Compliance with prescribed medications will improve Ability to identify changes in lifestyle to reduce recurrence of condition will improve Ability to verbalize feelings will improve Ability to disclose and discuss suicidal ideas Ability to demonstrate self-control will improve Ability  to identify and develop effective coping behaviors  will improve Ability to maintain clinical measurements within normal limits will improve Compliance with prescribed medications will improve  Medication Management: Evaluate patient's response, side effects, and tolerance of medication regimen.  Therapeutic Interventions: 1 to 1 sessions, Unit Group sessions and Medication administration.  Evaluation of Outcomes: Not Met  RN Treatment Plan for Primary Diagnosis: Severe recurrent major depression without psychotic features ([place]) Long Term Goal(s): Knowledge of disease and therapeutic regimen to maintain health will improve  Short Term Goals: Compliance with prescribed medications will improve  Medication Management: RN will administer medications as ordered by provider, will assess and evaluate patient's response and provide education to patient for prescribed medication. RN will report any adverse and/or side effects to prescribing provider.  Therapeutic Interventions: 1 on 1 counseling sessions, Psychoeducation, Medication administration, Evaluate responses to treatment, Monitor vital signs and CBGs as ordered, Perform/monitor CIWA, COWS, AIMS and Fall Risk screenings as ordered, Perform wound care treatments as ordered.  Evaluation of Outcomes: Not Met  LCSW Treatment Plan for Primary Diagnosis: Severe recurrent major depression without psychotic features ([place]) Long Term Goal(s): Safe transition to appropriate next level of care at discharge, Engage patient in therapeutic group addressing interpersonal concerns. Short Term Goals: Engage patient in aftercare planning with referrals and resources, Increase ability to appropriately verbalize feelings, Increase emotional regulation, Identify triggers associated with mental health/substance abuse issues and Increase skills for wellness and recovery  Therapeutic Interventions: Assess for all discharge needs, 1 to 1 time with Social worker,  Explore available resources and support systems, Assess for adequacy in community support network, Educate family and significant other(s) on suicide prevention, Complete Psychosocial Assessment, Interpersonal group therapy.  Evaluation of Outcomes: Not Met  Progress in Treatment: Attending groups: Pt is new to milieu, continuing to assess  Participating in groups: Pt is new to milieu, continuing to assess  Taking medication as prescribed: Yes, MD continues to assess for medication changes as needed Toleration medication: Yes, no side effects reported at this time Family/Significant other contact made: No, CSW attempting contact with pt's husband. Patient understands diagnosis: Continuing to assess Discussing patient identified problems/goals with staff: Yes Medical problems stabilized or resolved: Yes Denies suicidal/homicidal ideation: No, pt recently admitted with SI. Issues/concerns per patient self-inventory: None Other: N/A  New problem(s) identified: None identified at this time.   New Short Term/Long Term Goal(s): None identified at this time.   Discharge Plan or Barriers: Pt will return home and follow up with an outpatient provider.  Reason for Continuation of Hospitalization:  Anxiety  Depression Medication stabilization Suicidal ideation  Estimated Length of Stay: 3-5 days; Estimated discharge date 11/07/17  Attendees: Patient: 11/02/2017 9:05 AM  Physician: Dr. Sanjuana Letters 11/02/2017 9:05 AM  Nursing: Corbin Ade, RN 11/02/2017 9:05 AM  RN Care Manager:  11/02/2017 9:05 AM  Social Worker: Matthew Saras, [place] 11/02/2017 9:05 AM  Recreational Therapist:  11/02/2017 9:05 AM  Other: Lindell Spar, NP 11/02/2017 9:05 AM  Other:  11/02/2017 9:05 AM  Other: 11/02/2017 9:05 AM  Scribe for Treatment Team: Georga Kaufmann, MSW,LCSWA 11/02/2017 9:05 AM

## 2017-11-02 NOTE — Progress Notes (Signed)
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 Background history of MDD recurrent. Presented to the ER in company of her father. Took thirty pills of Aspirin in an attempt to end her own life. Expressed suicidal intent at the ER. Reported main stressor as marital disharmony. She cheated on her husband months ago and was distressed about this as she felt she must have been under some influence. Patient was managed medically and transferred to our unit. Consult team managed her on the floor. Her husband gave some input to consult team.   Chart reviewed today. Patient discussed at team today.   Staff reports that she has been focused on discharge. She has not voiced any suicidal thoughts. She comes across as blunted. Difficult to tease out the role of tinnitus in her affect. No behavioral issues. She has not required any emergency medication. Her husband called patient's staff. He is very focused on details of her affair. He did not voice any violent thoughts towards the patient or her lover.   Seen today. Patient is very focused on discharge. Says she has no suicidal thoughts or intent. Says she she has children and would never do that to them. Patient says her husband is eager to speak with the team. Says he is disabled and she wants to be with him soon. Reports eating well. Had some sleep and feels well rested. No violent thoughts. No homicidal or infanticidal thoughts.   Principal Problem: Severe recurrent major depression without psychotic features ([place]) Diagnosis:   Patient Active Problem List   Diagnosis Date Noted  . Severe recurrent major depression without psychotic features ([place]) [F33.2] 10/31/2017  . Salicylate overdose [C14.481E] 10/30/2017  . Suicidal behavior [R46.89] 10/30/2017  . Adjustment disorder with mixed disturbance of emotions and conduct [F43.25]   . Moderate episode of  recurrent major depressive disorder ([place]) [F33.1] 02/09/2017  . 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 Leucocytosis [D72.829] 09/26/2014   Total Time spent with patient: 30 minutes  Past Psychiatric History: As in H&P  Past Medical History:  Past Medical History:  Diagnosis Date  . Anemia   . Depression   . Medical history non-contributory   . Memory difficulty   . Problems with hearing     Past Surgical History:  Procedure Laterality Date  . EYE SURGERY    . [place]    x 4 other 3 as a child  . REFRACTIVE SURGERY  07/2014   Family History:  Family History  Problem Relation Age of Onset  . Diabetes Mellitus II Mother   . Diabetes Mother   . Prostate cancer Father   . Colon cancer Neg Hx    Family Psychiatric  History: As in H&P Social History:  Social History   Substance and Sexual Activity  Alcohol Use No     Social History   Substance and Sexual Activity  Drug Use No    Social History   Socioeconomic History  . Marital status: Unknown    Spouse name: None  . Number of children: 3  . Years of education: None  . Highest education level: None  Social Needs  . Financial resource strain: None  . Food insecurity - worry: None  . Food insecurity - inability: None  . Transportation needs - medical: None  . Transportation needs - non-medical: None  Occupational History  . None  Tobacco  Use  . Smoking status: Never Smoker  . Smokeless tobacco: Never Used  Substance and Sexual Activity  . Alcohol use: No  . Drug use: No  . Sexual activity: Yes    Birth control/protection: None  Other Topics Concern  . None  Social History Narrative   ** Merged History Encounter **       Additional Social History:      Sleep: Fair  Appetite:  Good  Current Medications: Current Facility-Administered Medications  Medication Dose Route Frequency Provider Last Rate Last Dose  . acetaminophen (TYLENOL) tablet 650 mg  650 mg Oral Q6H PRN Lindon Romp A, NP      . alum & mag hydroxide-simeth (MAALOX/MYLANTA) 200-200-20 MG/5ML suspension 30 mL  30 mL Oral Q4H PRN Lindon Romp A, NP      . FLUoxetine (PROZAC) capsule 40 mg  40 mg Oral Daily Lindon Romp A, NP   40 mg at 11/02/17 0758  . hydrOXYzine (ATARAX/VISTARIL) tablet 25 mg  25 mg Oral TID PRN Rozetta Nunnery, NP   25 mg at 11/01/17 2306  . magnesium hydroxide (MILK OF MAGNESIA) suspension 30 mL  30 mL Oral Daily PRN Lindon Romp A, NP      . traZODone (DESYREL) tablet 50 mg  50 mg Oral QHS PRN Rozetta Nunnery, NP        Lab Results:  Results for orders placed or performed during the hospital encounter of 10/31/17 (from the past 48 hour(s))  Hemoglobin A1c     Status: Abnormal   Collection Time: 11/01/17  6:52 AM  Result Value Ref Range   Hgb A1c MFr Bld 5.7 (H) 4.8 - 5.6 %    Comment: (NOTE) Pre diabetes:          5.7%-6.4% Diabetes:              >6.4% Glycemic control for   <7.0% adults with diabetes    Mean Plasma Glucose 116.89 mg/dL    Comment: Performed at [facility], [place] [address], [place], [place] [address]  Lipid panel     Status: None   Collection Time: 11/01/17  6:52 AM  Result Value Ref Range   Cholesterol 170 0 - 200 mg/dL   Triglycerides 128 <150 mg/dL   HDL 56 >40 mg/dL   Total CHOL/HDL Ratio 3.0 RATIO   VLDL 26 0 - 40 mg/dL   LDL Cholesterol 88 0 - 99 mg/dL    Comment:        Total Cholesterol/HDL:CHD Risk Coronary Heart Disease Risk Table                     Men   Women  1/2 Average Risk   3.4   3.3  Average Risk       5.0   4.4  2 X Average Risk   9.6   7.1  3 X Average Risk  23.4   11.0        Use the calculated Patient Ratio above and the CHD Risk Table to determine the patient's CHD Risk.        ATP III CLASSIFICATION (LDL):  <100     mg/dL   Optimal  100-129  mg/dL   Near or Above                    Optimal  130-159  mg/dL   Borderline  160-189  mg/dL   High  >190  mg/dL   Very High Performed at [facility],  [place] [address], [place], [place] [address]   TSH     Status: None   Collection Time: 11/01/17  6:52 AM  Result Value Ref Range   TSH 3.808 0.350 - 4.500 uIU/mL    Comment: Performed by a 3rd Generation assay with a functional sensitivity of <=0.01 uIU/mL. Performed at [facility], [place] [address], [place], [place] [address]     Blood Alcohol level:  Lab Results  Component Value Date   [facility] <10 10/29/2017   ETH <11 19/62/2297    Metabolic Disorder Labs: Lab Results  Component Value Date   HGBA1C 5.7 (H) 11/01/2017   MPG 116.89 11/01/2017   No results found for: PROLACTIN Lab Results  Component Value Date   CHOL 170 11/01/2017   TRIG 128 11/01/2017   HDL 56 11/01/2017   CHOLHDL 3.0 11/01/2017   VLDL 26 11/01/2017   LDLCALC 88 11/01/2017    Physical Findings: AIMS: Facial and Oral Movements Muscles of Facial Expression: None, normal Lips and Perioral Area: None, normal Jaw: None, normal Tongue: None, normal,Extremity Movements Upper (arms, wrists, hands, fingers): None, normal Lower (legs, knees, ankles, toes): None, normal, Trunk Movements Neck, shoulders, hips: None, normal, Overall Severity Severity of abnormal movements (highest score from questions above): None, normal Incapacitation due to abnormal movements: None, normal Patient's awareness of abnormal movements (rate only patient's report): No Awareness, Dental Status Current problems with teeth and/or dentures?: No Does patient usually wear dentures?: No  CIWA:    COWS:     Musculoskeletal: Strength & Muscle Tone: within normal limits Gait & Station: normal Patient leans: N/A  Psychiatric Specialty Exam: Physical Exam  Constitutional: She is oriented to person, place, and time. She appears well-developed and well-nourished.  HENT:  Head: Normocephalic and atraumatic.  Respiratory: Effort normal.  Neurological: She is alert and oriented to person, place, and time.  Psychiatric:  As  above    ROS  Blood pressure 111/73, pulse 86, temperature 98.3 F (36.8 C), temperature source Oral, resp. rate 16, height 5\' 3"  (1.6 m), weight 62.1 kg (137 lb).Body mass index is 24.27 kg/m.  General Appearance: Neatly dressed, slightly withdrawn. Cooperative.   Eye Contact:  Good  Speech:  Soft spoken  Volume:  Decreased  Mood:  Depressed  Affect:  Restricted mostly  Thought Process:  Linear  Orientation:  Full (Time, Place, and Person)  Thought Content:  Conflicting thoughts about her love life. No thoughts of violence. No hallucination in any modality.   Suicidal Thoughts:  None since she has been here.  Homicidal Thoughts:  No  Memory:  Immediate;   Good Recent;   Good Remote;   Good  Judgement:  Fair  Insight:  Good  Psychomotor Activity:  Decreased  Concentration:  Concentration: Fair and Attention Span: Fair  Recall:  Good  Fund of Knowledge:  Good  Language:  Good  Akathisia:  Negative  Handed:    AIMS (if indicated):     Assets:  Communication Skills Desire for Improvement Housing Physical Health Resilience  ADL's:  Intact  Cognition:  WNL  Sleep:  Number of Hours: 5.5     Treatment Plan Summary: Patient is still depressed. She is not reporting any suicidal thoughts and very focused on discharge. Factors that led to suicidal attempt are still there. We plan to gather further collateral from her family. We plan to optimize her antidepressant further.   Psychiatric:  MDD Recurrent  Medical:  Psychosocial:  Marital conflict  PLAN: 1. Increase Fluoxetine to 60 mg daily.  2. Continue to encourage unit groups and activities 3. Continue to monitor mood, behavior and interaction with peers 4. SW would gather collateral from her husband    [place], MD 11/02/2017, 1:45 PM

## 2017-11-02 NOTE — Progress Notes (Signed)
D: Patient is focused on discharge today.  She denies any depressive symptoms or thoughts of self harm.  Her goal today is to "be happy."  Patient continues to present with flat, blunted affect.  She is hard of hearing due to ringing in her ears from the aspirin overdose.  She is sleeping and eating well.  Patient is attending groups and appears engaged with her peers and staff.   A: Continue to monitor medication management and MD orders.  Safety checks completed every 15 minutes per protocol.  Offer support and encouragement as needed. R: Patient is receptive to staff; his behavior is appropriate.

## 2017-11-03 DIAGNOSIS — F4325 Adjustment disorder with mixed disturbance of emotions and conduct: Secondary | ICD-10-CM

## 2017-11-03 DIAGNOSIS — F39 Unspecified mood [affective] disorder: Secondary | ICD-10-CM

## 2017-11-03 DIAGNOSIS — F419 Anxiety disorder, unspecified: Secondary | ICD-10-CM

## 2017-11-03 DIAGNOSIS — T39091A Poisoning by salicylates, accidental (unintentional), initial encounter: Secondary | ICD-10-CM

## 2017-11-03 DIAGNOSIS — G4700 Insomnia, unspecified: Secondary | ICD-10-CM
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
## 2017-11-03 NOTE — Plan of Care (Signed)
Safety Care Plan Documentation  Goal: Periods of time without injury will increase Intervention: Patient is on q15 minute safety checks and low fall risk precautions. Patient verbally contracts for safety on the unit. Outcome: Patient remains safe at this time  11/03/2017 11:22 PM - Progressing by Annia Friendly, RN   Health Behavior/Discharge Planning Care Plan Documentation  Goal: Compliance for underlying treatment cause of condition will improve Intervention: Encourage compliance with groups/medication regimen.  Outcome: Patient taking medications and attending groups per plan of care. Patient verbalizes understanding and is agreeable to current plan of care.  11/03/2017 11:22 PM - Progressing by Annia Friendly, RN

## 2017-11-03 NOTE — Progress Notes (Signed)
Adult Psychoeducational Group Note  Date:  11/03/2017 Time:  9:10 PM  Group Topic/Focus:  Wrap-Up Group:   The focus of this group is to help patients review their daily goal of treatment and discuss progress on daily workbooks.  Participation Level:  Active  Participation Quality:  Appropriate  Affect:  Appropriate  Cognitive:  Oriented  Insight: Appropriate  Engagement in Group:  Engaged  Modes of Intervention:  Activity  Additional Comments:  Pt rated her day a 82. Goal is to get better for her kids.  Gloriajean Dell Unity Luepke 11/03/2017, 9:10 PM

## 2017-11-03 NOTE — Plan of Care (Signed)
Safety Care Plan Documentation  Goal: Periods of time without injury will increase Intervention: Patient is on q15 minute safety checks and low fall risk precautions. Patient verbally contracts for safety on the unit. Outcome: Patient remains safe at this time  11/03/2017 4:07 AM - Progressing by Annia Friendly, RN   Health Behavior/Discharge Planning Care Plan Documentation  Goal: Compliance for underlying treatment cause of condition will improve Intervention: Encourage compliance with groups/medication regimen.  Outcome: Patient taking medications and attending groups per plan of care. Patient verbalizes understanding and is agreeable to current plan of care.  11/03/2017 4:07 AM - Progressing by Annia Friendly, RN   Activity Care Plan Documentation  Goal: Sleeping Patterns Will Improve Intervention: Provide sleep medications as needed and provide uninterrupted periods of sleep. Outcome: Patient is resting in bed with eyes closed; respirations even and unlabored. Patient reports sleeping well with current regimen. MHT observes patient resting during q15 minute checks.  11/03/2017 4:07 AM - Progressing by Annia Friendly, RN

## 2017-11-03 NOTE — Progress Notes (Signed)
Nursing Progress Note [id number]  D) Patient presents calm, pleasant and cooperative. Patient did attend group. Patient is seen minimally interactive in the milieu. Patient denies SI/HI/AVH or pain. Patient contracts for safety on the unit. Patient requests Trazodone for sleep. Patient denies concerns for writer this evening.  A) Emotional support given. 1:1 interaction and active listening provided. Patient medicated as prescribed. Medications and plan of care reviewed with patient. Patient verbalized understanding without further questions. Snacks and fluids provided. Opportunities for questions or concerns presented to patient. Patient encouraged to continue to work on treatment goals. Labs, vital signs and patient behavior monitored throughout shift. Patient safety maintained with q15 min safety checks. Low fall risk precautions in place and reviewed with patient; patient verbalized understanding.  R) Patient receptive to interaction with nurse. Patient remains safe on the unit at this time. Patient denies any adverse medication reactions at this time. Patient is resting in bed without complaints. Will continue to monitor.

## 2017-11-03 NOTE — BHH Group Notes (Signed)
LCSW Group Therapy Note  11/03/2017 1:15pm  Type of Therapy/Topic:  Group Therapy:  Feelings about Diagnosis  Participation Level: Active  Description of Group:   This group will allow patients to explore their thoughts and feelings about diagnoses they have received. Patients will be guided to explore their level of understanding and acceptance of these diagnoses. Facilitator will encourage patients to process their thoughts and feelings about the reactions of others to their diagnosis and will guide patients in identifying ways to discuss their diagnosis with significant others in their lives. This group will be process-oriented, with patients participating in exploration of their own experiences, giving and receiving support, and processing challenge from other group members.   Therapeutic Goals: 1. Patient will demonstrate understanding of diagnosis as evidenced by identifying two or more symptoms of the disorder 2. Patient will be able to express two feelings regarding the diagnosis 3. Patient will demonstrate their ability to communicate their needs through discussion and/or role play   Therapeutic Modalities:   Cognitive Behavioral Therapy Brief Therapy Feelings Identification    Georga Kaufmann, MSW, [facility] 11/03/2017 3:34 PM

## 2017-11-03 NOTE — Progress Notes (Signed)
Patient ID: Angela Romero, female   DOB: [birth year], 41 y.o.   MRN: [id number] Patient took scheduled med this morning, denies SI/HI/AVH, affect is blunted, however, pt denies having any concerns.  Pt ate a 100% of her breakfast, and is currently at lunch, pt has been visible in the day room talking on the phone, and has verbalized readiness for discharge, and her other treatment team members have been notified.  Q15 minute checks in place, will continue to monitor.

## 2017-11-03 NOTE — Progress Notes (Signed)
Recreation Therapy Notes  Animal-Assisted Activity (AAA) Program Checklist/Progress Notes Patient Eligibility Criteria Checklist & Daily Group note for Rec TxIntervention  Date: 11.13.2018 Time: 2:45pm Location: [address]   AAA/T Program Assumption of Risk Form signed by Patient/ or Parent Legal Guardian Yes  Patient is free of allergies or sever asthma Yes  Patient reports no fear of animals Yes  Patient reports no history of cruelty to animals Yes  Patient understands his/her participation is voluntary Yes  Patient washes hands before animal contact Yes  Patient washes hands after animal contact Yes  Behavioral Response: Appropriate   Education:Hand Washing, Appropriate Animal Interaction   Education Outcome: Acknowledges education.   Clinical Observations/Feedback: Patient attended session and interacted appropriately with therapy dog and peers.   Laureen Ochs Daya Dutt, LRT/CTRS        Jamal Pavon L 11/03/2017 3:10 PM

## 2017-11-03 NOTE — Progress Notes (Signed)
Nursing Progress Note [id number]  D) Patient presents calm, pleasant and cooperative. Patient attended group this evening. Patient denied concerns for writer this evening but reports she is hopeful for discharge tomorrow. Patient denies SI/HI/AVH or pain. Patient contracts for safety on the unit. Patient requests sleep medication this evening.  A) Emotional support given. 1:1 interaction and active listening provided. Patient medicated as prescribed. Medications and plan of care reviewed with patient. Patient verbalized understanding without further questions. Snacks and fluids provided. Opportunities for questions or concerns presented to patient. Patient encouraged to continue to work on treatment goals. Labs, vital signs and patient behavior monitored throughout shift. Patient safety maintained with q15 min safety checks. Low fall risk precautions in place and reviewed with patient; patient verbalized understanding.  R) Patient receptive to interaction with nurse. Patient remains safe on the unit at this time. Patient denies any adverse medication reactions at this time. Patient is resting in bed without complaints. Will continue to monitor.

## 2017-11-03 NOTE — BHH Suicide Risk Assessment (Signed)
[place] INPATIENT:  Family/Significant Other Suicide Prevention Education  Suicide Prevention Education:  Education Completed; Angela Romero (husband [phone]), has been identified by the patient as the family member/significant other with whom the patient will be residing, and identified as the person(s) who will aid the patient in the event of a mental health crisis (suicidal ideations/suicide attempt).  With written consent from the patient, the family member/significant other has been provided the following suicide prevention education, prior to the and/or following the discharge of the patient.  The suicide prevention education provided includes the following:  Suicide risk factors  Suicide prevention and interventions  National Suicide Hotline telephone number  [facility] assessment telephone number  [facility] Emergency Assistance [place] and/or Residential Mobile Crisis Unit telephone number  Request made of family/significant other to:  Remove weapons (e.g., guns, rifles, knives), all items previously/currently identified as safety concern.    Remove drugs/medications (over-the-counter, prescriptions, illicit drugs), all items previously/currently identified as a safety concern.  The family member/significant other verbalizes understanding of the suicide prevention education information provided.  The family member/significant other agrees to remove the items of safety concern listed above.  Pt's husband states that pt seems stable for discharge. Pt's husband has no concerns about pt discharging tomorrow. Pt's husband also expressed his intention to be supportive of the pt upon discharge.   Georga Kaufmann, MSW, LCSWA  11/03/2017, 3:41 PM

## 2017-11-04 MED ORDER — FLUOXETINE HCL 20 MG PO CAPS: 60.0000 mg | ORAL_CAPSULE | Freq: Every day | ORAL | 0 refills | Status: DC

## 2017-11-04 MED ORDER — HYDROXYZINE HCL 25 MG PO TABS: 25.0000 mg | ORAL_TABLET | Freq: Three times a day (TID) | ORAL | 0 refills | Status: DC | PRN

## 2017-11-04 MED ORDER — TRAZODONE HCL 50 MG PO TABS: 50.0000 mg | ORAL_TABLET | Freq: Every evening | ORAL | 0 refills | Status: DC | PRN

## 2017-11-04 NOTE — BHH Group Notes (Signed)
[facility] Mental [facility] Association Group Therapy 11/04/2017 1:15pm  Type of Therapy: Mental Health Association Presentation  Participation Level: Active  Participation Quality: Attentive  Affect: Appropriate  Cognitive: Oriented  Insight: Developing/Improving  Engagement in Therapy: Engaged  Modes of Intervention: Discussion, Education and Socialization  Summary of Progress/Problems: Mental Health Association ([place]) Speaker came to talk about his personal journey with living with a mental health diagnosis. The pt processed ways by which to relate to the speaker. [place] speaker provided handouts and educational information pertaining to groups and services offered by the [facility]. Pt was engaged in speaker's presentation and was receptive to resources provided.    Georga Kaufmann, MSW, LCSWA 11/04/2017 3:30 PM

## 2017-11-04 NOTE — Progress Notes (Signed)
Recreation Therapy Notes  Date:  11/04/17 Time: 0930 Location: [address] Dayroom  Group Topic: Stress Management  Goal Area(s) Addresses:  Patient will verbalize importance of using healthy stress management.  Patient will identify positive emotions associated with healthy stress management.   Behavioral Response: Engaged  Intervention: Stress Management  Activity :  Guided Imagery.  LRT introduced the stress management technique of guided imagery.  LRT read a script to help patients envision their safe and peaceful place.  Patients were to follow along as LRT read script.  Education:  Stress Management, Discharge Planning.   Education Outcome: Acknowledges edcuation/In group clarification offered/Needs additional education  Clinical Observations/Feedback: Pt attended group.    Victorino Sparrow, LRT/CTRS         Victorino Sparrow A 11/04/2017 12:37 PM

## 2017-11-04 NOTE — Discharge Summary (Signed)
Physician Discharge Summary Note  Patient:  Angela Romero is an 41 y.o., female MRN:  [id number] DOB:  02/22/1976 Patient phone:  [phone] (home)  Patient address:   [place] [address],  Total Time spent with patient: 20 minutes  Date of Admission:  10/31/2017 Date of Discharge: 11/04/17  Reason for Admission:  Worsening depression and SI  Principal Problem: Severe recurrent major depression without psychotic features [facility]) Discharge Diagnoses: Patient Active Problem List   Diagnosis Date Noted  . Severe recurrent major depression without psychotic features ([place]) [F33.2] 10/31/2017  . Salicylate overdose [Z76.734L] 10/30/2017  . Suicidal behavior [R46.89] 10/30/2017  . Adjustment disorder with mixed disturbance of emotions and conduct [F43.25]   . Moderate episode of recurrent major depressive disorder ([place]) [F33.1] 02/09/2017  . 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 Leucocytosis [D72.829] 09/26/2014    Past Psychiatric History: Long history of unipolar depression. Has responded well to Fluoxetine. No past mania. No past inpatient care. No past suicidal behavior. She is followed by Dr. Gwenlyn Perking. Her family therapist is Canary Brim    Past Medical History:  Past Medical History:  Diagnosis Date  . Anemia   . Depression   . Medical history non-contributory   . Memory difficulty   . Problems with hearing     Past Surgical History:  Procedure Laterality Date  . EYE SURGERY    . [place]    x 4 other 3 as a child  . REFRACTIVE SURGERY  07/2014   Family History:  Family History  Problem Relation Age of Onset  . Diabetes Mellitus II Mother   . Diabetes Mother   . Prostate cancer Father   . Colon cancer Neg Hx    Family Psychiatric  History: Mother had depression. No family history of addiction or suicide.    Social History:  Social History   Substance and Sexual Activity  Alcohol Use No     Social History    Substance and Sexual Activity  Drug Use No    Social History   Socioeconomic History  . Marital status: Unknown    Spouse name: None  . Number of children: 3  . Years of education: None  . Highest education level: None  Social Needs  . Financial resource strain: None  . Food insecurity - worry: None  . Food insecurity - inability: None  . Transportation needs - medical: None  . Transportation needs - non-medical: None  Occupational History  . None  Tobacco Use  . Smoking status: Never Smoker  . Smokeless tobacco: Never Used  Substance and Sexual Activity  . Alcohol use: No  . Drug use: No  . Sexual activity: Yes    Birth control/protection: None  Other Topics Concern  . 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 Background history of MDD recurrent. Presented to the ER in company of her father. Took thirty pills of Aspirin in an attempt to end her own life. Expressed suicidal intent at the ER. Reported main stressor as marital disharmony. She cheated on her husband months ago and was distressed about this as she felt she must have been under some influence. Patient was managed medically and transferred to our unit. Consult team managed her on the floor. Her husband gave some input to consult  team.  At interview, patient clarified that she presented in company of her husband. Says he is much older than she is an easily misidentified as her father. Patient reports having an affair four months ago. Says she has been attending family therapy with her husband to resolve the conflict. Patient reports that she still has strong feelings for the person she had an affair with but does not want to loose her family. Says her husband has become very clingy since the affair. He brings it up all the time. Patient reports they have been having a lot of argument.  Recent overdose was precipitated by an argument. Says she went to the kitchen and took the pills. Says at that time she acted impulsive though she had repeatedly threatened to harm self before. Patient did not plan this. Says medication had been in the cabinet for months. She did not do anything to conceal what she did. Her husband came down and found the pill bottle. Patient did not send any goodbye message. She tells me that she has been able to process the event. Says she loves her children and would not like to put them through such emotional pain. Says she hopes to go home soon and get back to her routines. Reports good response to Fluoxetine. She had been on it for years. She is sleeping normally. Her appetite is good. Says she has not had any changes in weight. Some ringing in the ear due to salicylate OD. Some difficulty with speed of processing information as she struggles to hear. No hallucination in any modality. No abnormal belief. No evidence of mania. No violent thoughts. No homicidal thoughts. No substance use. No access to weapons.   Patient remained on the [facility] unit for 3 days and stabilized with medications and therapy. Patient was continued and titrated to 60 mg Daily. Patient was also using Vistaril 25 mg for anxiety and Trazodone 50 mg QHS PRN for insomnia. Patient showed improvement with improved sleep, mood, affect, appetite, and interaction. Patient's husband was contacted and he feels safe with her coming home. Patient attended group and participated. Patient was seen in the day room interacting with peers and staff appropriately. Patient was provided with prescriptions for her medications upon discharge. Patient denied any SI/HI/AVh and contracts for safety. Patient has plans to follow up with her current therapist.    Physical Findings: AIMS: Facial and Oral Movements Muscles of Facial Expression: None, normal Lips and Perioral Area: None, normal Jaw: None, normal Tongue: None,  normal,Extremity Movements Upper (arms, wrists, hands, fingers): None, normal Lower (legs, knees, ankles, toes): None, normal, Trunk Movements Neck, shoulders, hips: None, normal, Overall Severity Severity of abnormal movements (highest score from questions above): None, normal Incapacitation due to abnormal movements: None, normal Patient's awareness of abnormal movements (rate only patient's report): No Awareness, Dental Status Current problems with teeth and/or dentures?: No Does patient usually wear dentures?: No  CIWA:    COWS:     Musculoskeletal: Strength & Muscle Tone: within normal limits Gait & Station: normal Patient leans: N/A  Psychiatric Specialty Exam: Physical Exam  Nursing note and vitals reviewed. Constitutional: She is oriented to person, place, and time. She appears well-developed and well-nourished.  Cardiovascular: Normal rate and regular rhythm.  Respiratory: Effort normal.  Musculoskeletal: Normal range of motion.  Neurological: She is alert and oriented to person, place, and time.  Skin: Skin is warm.    Review of Systems  Constitutional: Negative.   HENT: Negative.  Eyes: Negative.   Respiratory: Negative.   Cardiovascular: Negative.   Gastrointestinal: Negative.   Genitourinary: Negative.   Musculoskeletal: Negative.   Skin: Negative.   Neurological: Negative.   Endo/Heme/Allergies: Negative.   Psychiatric/Behavioral: Negative.     Blood pressure 114/68, pulse 85, temperature 98.1 F (36.7 C), temperature source Oral, resp. rate 18, height 5\' 3"  (1.6 m), weight 62.1 kg (137 lb).Body mass index is 24.27 kg/m.  General Appearance: Casual  Eye Contact:  Good  Speech:  Clear and Coherent and Normal Rate  Volume:  Normal  Mood:  Euthymic  Affect:  Appropriate  Thought Process:  Goal Directed and Descriptions of Associations: Intact  Orientation:  Full (Time, Place, and Person)  Thought Content:  WDL  Suicidal Thoughts:  No  Homicidal  Thoughts:  No  Memory:  Immediate;   Good Recent;   Good Remote;   Good  Judgement:  Good  Insight:  Good  Psychomotor Activity:  Normal  Concentration:  Concentration: Good and Attention Span: Good  Recall:  Good  Fund of Knowledge:  Good  Language:  Good  Akathisia:  No  Handed:  Right  AIMS (if indicated):     Assets:  Communication Skills Desire for Improvement Financial Resources/Insurance [place] Support Transportation  ADL's:  Intact  Cognition:  WNL  Sleep:  Number of Hours: 6.25     Have you used any form of tobacco in the last 30 days? (Cigarettes, Smokeless Tobacco, Cigars, and/or Pipes): No  Has this patient used any form of tobacco in the last 30 days? (Cigarettes, Smokeless Tobacco, Cigars, and/or Pipes) Yes, No  Blood Alcohol level:  Lab Results  Component Value Date   [facility] <10 10/29/2017   ETH <11 71/24/5809    Metabolic Disorder Labs:  Lab Results  Component Value Date   HGBA1C 5.7 (H) 11/01/2017   MPG 116.89 11/01/2017   No results found for: PROLACTIN Lab Results  Component Value Date   CHOL 170 11/01/2017   TRIG 128 11/01/2017   HDL 56 11/01/2017   CHOLHDL 3.0 11/01/2017   VLDL 26 11/01/2017   LDLCALC 88 11/01/2017    See Psychiatric Specialty Exam and Suicide Risk Assessment completed by Attending Physician prior to discharge.  Discharge destination:  Home  Is patient on multiple antipsychotic therapies at discharge:  No   Has Patient had three or more failed trials of antipsychotic monotherapy by history:  No  Recommended Plan for Multiple Antipsychotic Therapies: NA   Allergies as of 11/04/2017   No Known Allergies     Medication List    TAKE these medications     Indication  FLUoxetine 20 MG capsule Commonly known as:  PROZAC Take 3 capsules (60 mg total) daily by mouth. For mood control What changed:    medication strength  how much to take  additional instructions  Indication:  mood  stability   hydrOXYzine 25 MG tablet Commonly known as:  ATARAX/VISTARIL Take 1 tablet (25 mg total) 3 (three) times daily as needed by mouth for anxiety.  Indication:  Feeling Anxious   traZODone 50 MG tablet Commonly known as:  DESYREL Take 1 tablet (50 mg total) at bedtime as needed by mouth for sleep.  Indication:  Trouble Sleeping        Follow-up recommendations:  Continue activity as tolerated. Continue diet as recommended by your PCP. Ensure to keep all appointments with outpatient providers.  Comments:  Patient is instructed prior to discharge to: Take  all medications as prescribed by his/her mental healthcare provider. Report any adverse effects and or reactions from the medicines to his/her outpatient provider promptly. Patient has been instructed & cautioned: To not engage in alcohol and or illegal drug use while on prescription medicines. In the event of worsening symptoms, patient is instructed to call the crisis hotline, 911 and or go to the nearest ED for appropriate evaluation and treatment of symptoms. To follow-up with his/her primary care provider for your other medical issues, concerns and or health care needs.    Signed: Lowry Ram Money, FNP 11/04/2017, 8:33 AM  Patient seen, Suicide Assessment Completed.  Disposition Plan Reviewed

## 2017-11-04 NOTE — Progress Notes (Signed)
  [facility] Adult Case Management Discharge Plan :  Will you be returning to the same living situation after discharge:  Yes,  pt returning home. At discharge, do you have transportation home?: Yes,  pt's husband will transport. Do you have the ability to pay for your medications: Yes,  pt has insurance.  Release of information consent forms completed and in the chart;  Patient's signature needed at discharge.  Patient to Follow up at: Follow-up Information    [facility] Follow up on 11/23/2017.   Why:  Hospital follow-up appointment 12/3 at 11am with Dr. Quay Burow. This was the first available appointment with your preferred provider. Contact information: [address] [place], [place] [address] P: [phone] F: [phone]       [facility] Follow up.   Specialty:  Behavioral Health Why:  If you have any mental health concerns between discharge and your first scheduled outpatient appointment, please go to [facility] to be seen as a walk-in. Walk-in hours are Mon-Fri 8am-3pm. Please arrive as early as possible to be sure that you are seen. Contact information: [place]  [address] [phone]           Next level of care provider has access to [place] and Suicide Prevention discussed: Yes,  with pt and with pt's husband.  Have you used any form of tobacco in the last 30 days? (Cigarettes, Smokeless Tobacco, Cigars, and/or Pipes): No  Has patient been referred to the Quitline?: N/A patient is not a smoker  Patient has been referred for addiction treatment: [place], MSW, LCSWA 11/04/2017, 10:38 AM

## 2017-11-04 NOTE — Progress Notes (Signed)
DC teaching done. Pt contracts for safety. She denies SI today and she rates her depression, hopelessness and anxeity 4/7/6, respectively.  [place] teaching reviewed with pt and  Pt stated verbaal understanding. Pt given cc of AVS, SRA, SSP and transition record. Pt escorted to bldg entance and dc'd

## 2017-11-04 NOTE — BHH Suicide Risk Assessment (Signed)
[facility] Discharge Suicide Risk Assessment   Principal Problem: Severe recurrent major depression without psychotic features [facility]) Discharge Diagnoses:  Patient Active Problem List   Diagnosis Date Noted  . Severe recurrent major depression without psychotic features ([place]) [F33.2] 10/31/2017  . Salicylate overdose [X73.532D] 10/30/2017  . Suicidal behavior [R46.89] 10/30/2017  . Adjustment disorder with mixed disturbance of emotions and conduct [F43.25]   . Moderate episode of recurrent major depressive disorder ([place]) [F33.1] 02/09/2017  . Mushroom poisoning [J24.2A8T] 09/26/2014  . Leucocytosis [D72.829] 09/26/2014    Total Time spent with patient: 30 minutes  Musculoskeletal: Strength & Muscle Tone: within normal limits Gait & Station: normal Patient leans: N/A  Psychiatric Specialty Exam: ROSdenies headache, no chest pain, no shortness of breath, no vomiting   Blood pressure 114/68, pulse 85, temperature 98.1 F (36.7 C), temperature source Oral, resp. rate 18, height 5\' 3"  (1.6 m), weight 62.1 kg (137 lb).Body mass index is 24.27 kg/m.  General Appearance: Well Groomed  Eye Contact::  Good  Speech:  Normal Rate409  Volume:  Normal  Mood:  reports improved mood, describes mood as " a lot better"  Affect:  Appropriate and slightly constricted, but smiles appropriately during session  Thought Process:  Linear and Descriptions of Associations: Intact  Orientation:  Other:  fully alert and attentive   Thought Content:  denies hallucinations, no delusions expressed   Suicidal Thoughts:  No denies suicidal ideations, denies homicidal or violent ideations  Homicidal Thoughts:  No  Memory:  recent and remote grossly intact   Judgement:  Other:  improving   Insight:  improving   Psychomotor Activity:  Normal  Concentration:  Good  Recall:  Good  Fund of Knowledge:Good  Language: Good  Akathisia:  Negative  Handed:  Right  AIMS (if indicated):     Assets:  Desire for  Improvement Resilience  Sleep:  Number of Hours: 6.25  Cognition: WNL  ADL's:  Intact   Mental Status Per Nursing Assessment::   On Admission:     Demographic Factors:  41 year old married female, has three children   Loss Factors: Marital tension  Historical Factors: No prior psychiatric admissions, denies prior suicide attempts.  Risk Reduction Factors:   Responsible for children under 28 years of age, Sense of responsibility to family, Living with another person, especially a relative and Positive coping skills or problem solving skills  Continued Clinical Symptoms:  At this time patient is reporting feeling better, less depressed, affect remains vaguely constricted, but does smile at times appropriately . Denies suicidal ideations, no homicidal ideations, denies hallucinations. Future oriented .   Cognitive Features That Contribute To Risk:  No gross cognitive deficits noted upon discharge. Is alert , attentive, and oriented x 3   Suicide Risk:  Mild:  Suicidal ideation of limited frequency, intensity, duration, and specificity.  There are no identifiable plans, no associated intent, mild dysphoria and related symptoms, good self-control (both objective and subjective assessment), few other risk factors, and identifiable protective factors, including available and accessible social support.  Follow-up Information    [facility] Follow up on 11/23/2017.   Why:  Hospital follow-up appointment 12/3 at 11am with Dr. Quay Burow. This was the first available appointment with your preferred provider. Contact information: [address] [place], [place] [address] P: [phone] F: [phone]       [facility] Follow up.   Specialty:  Behavioral Health Why:  If you have any mental health concerns between discharge and  your first scheduled outpatient appointment, please go to [facility] to be seen as a walk-in. Walk-in hours are Mon-Fri 8am-3pm. Please arrive as early as  possible to be sure that you are seen. Contact information: [place] [address] [phone]           Plan Of Care/Follow-up recommendations:  Activity:  as tolerated  Diet:  regular Tests:  NA Other:  see below  Patient is requesting discharge and there are no current grounds for involuntary commitment  She is planning on returning home  She plans to follow up as above  Jenne Campus, MD 11/04/2017, 10:40 AM

## 2017-12-16 ENCOUNTER — Inpatient Hospital Stay (HOSPITAL_COMMUNITY)
Admission: AD | Admit: 2017-12-16 | Discharge: 2017-12-16 | Discharge status: Against Medical Advice | Payer: Medicaid Other | Source: Ambulatory Visit | Attending: Obstetrics & Gynecology | Admitting: Obstetrics & Gynecology

## 2017-12-16 ENCOUNTER — Encounter (HOSPITAL_COMMUNITY): Payer: Self-pay

## 2017-12-16 ENCOUNTER — Ambulatory Visit (HOSPITAL_COMMUNITY)
Admission: EM | Admit: 2017-12-16 | Discharge: 2017-12-16 | Disposition: A | Discharge status: Home / Self Care | Payer: Medicaid Other

## 2017-12-16 DIAGNOSIS — Z5321 Procedure and treatment not carried out due to patient leaving prior to being seen by health care provider: Secondary | ICD-10-CM | POA: Insufficient documentation

## 2017-12-16 LAB — URINALYSIS, ROUTINE W REFLEX MICROSCOPIC
Bilirubin Urine: NEGATIVE
Glucose, UA: NEGATIVE mg/dL
Hgb urine dipstick: NEGATIVE
KETONES UR: NEGATIVE mg/dL
LEUKOCYTES UA: NEGATIVE
NITRITE: NEGATIVE
PROTEIN: NEGATIVE mg/dL
Specific Gravity, Urine: 1.024 (ref 1.005–1.030)
pH: 5 (ref 5.0–8.0)

## 2017-12-16 LAB — POCT PREGNANCY, URINE: PREG TEST UR: POSITIVE — AB

## 2017-12-16 NOTE — MAU Note (Signed)
Registration staff called to state that pt left AMA.

## 2017-12-16 NOTE — MAU Note (Signed)
Pt states that she feels faint and like she has to pass out. States it started this afternoon. Also has a HA. States her stomach hurts intermittently that started several days ago. Pt states she took advil pm last night-states it did help. Pt denies vaginal bleeding and discharge. LMP: 11/02/2017.

## 2017-12-28 ENCOUNTER — Ambulatory Visit (HOSPITAL_COMMUNITY)
Admission: EM | Admit: 2017-12-28 | Discharge: 2017-12-28 | Disposition: A | Discharge status: Home / Self Care | Payer: Medicaid Other | Attending: Emergency Medicine | Admitting: Emergency Medicine

## 2017-12-28 ENCOUNTER — Encounter (HOSPITAL_COMMUNITY): Payer: Self-pay | Admitting: Emergency Medicine

## 2017-12-28 DIAGNOSIS — H65113 Acute and subacute allergic otitis media (mucoid) (sanguinous) (serous), bilateral: Secondary | ICD-10-CM | POA: Diagnosis not present

## 2017-12-28 DIAGNOSIS — M791 Myalgia, unspecified site: Secondary | ICD-10-CM | POA: Diagnosis not present

## 2017-12-28 DIAGNOSIS — M542 Cervicalgia: Secondary | ICD-10-CM

## 2017-12-28 MED ORDER — METHOCARBAMOL 500 MG PO TABS: 500.0000 mg | ORAL_TABLET | Freq: Two times a day (BID) | ORAL | 0 refills | Status: DC

## 2017-12-28 MED ORDER — AMOXICILLIN 500 MG PO CAPS: 500.0000 mg | ORAL_CAPSULE | Freq: Three times a day (TID) | ORAL | 0 refills | Status: DC

## 2017-12-28 MED ORDER — KETOROLAC TROMETHAMINE 30 MG/ML IJ SOLN
30.0000 mg | Freq: Once | INTRAMUSCULAR | Status: AC
  Administered 2017-12-28: 30 mg via INTRAMUSCULAR

## 2017-12-28 MED ORDER — KETOROLAC TROMETHAMINE 30 MG/ML IJ SOLN
INTRAMUSCULAR | Status: AC
  Filled 2017-12-28: qty 1

## 2017-12-28 NOTE — ED Provider Notes (Signed)
[facility]    CSN: [id number] Arrival date & time: 12/28/17  1548     History   Chief Complaint Chief Complaint  Patient presents with  . URI  . Neck Pain    HPI Angela Romero is a 42 y.o. female.   Pt c/o lt eat pain for the past few days with a sore throat, states that her husband had similar sx over the past week. Has not taken anything . Denies any fevers , no n/v but has had some fevers.   Pt also c/o rt shoulder and neck pain states that everytime she moves her neck it seems as tho it is pulling on the muscle. Denies any injury. Has  Taken naproxen and it has helped some.       Past Medical History:  Diagnosis Date  . Anemia   . Depression   . Medical history non-contributory   . Memory difficulty   . Problems with hearing     Patient Active Problem List   Diagnosis Date Noted  . Severe recurrent major depression without psychotic features ([place]) 10/31/2017  . Salicylate overdose 24/58/0998  . Suicidal behavior 10/30/2017  . Adjustment disorder with mixed disturbance of emotions and conduct   . Moderate episode of recurrent major depressive disorder ([place]) 02/09/2017  . 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 Leucocytosis 09/26/2014    Past Surgical History:  Procedure Laterality Date  . EYE SURGERY    . [place]    x 4 other 3 as a child  . REFRACTIVE SURGERY  07/2014    OB History    Gravida Para Term Preterm AB Living   1 0 0 0 0     SAB TAB Ectopic Multiple Live Births   0 0 0           Home Medications    Prior to Admission medications   Medication Sig Start Date End Date Taking? Authorizing Provider  FLUoxetine (PROZAC) 20 MG capsule Take 3 capsules (60 mg total) daily by mouth. For mood control 11/04/17  Yes Money, Lowry Ram, FNP  hydrOXYzine (ATARAX/VISTARIL) 25 MG tablet Take 1 tablet (25 mg total) 3 (three) times daily as needed by mouth for anxiety. 11/04/17  Yes Money, Lowry Ram, FNP  amoxicillin (AMOXIL) 500  MG capsule Take 1 capsule (500 mg total) by mouth 3 (three) times daily. 12/28/17   Marney Setting, NP  methocarbamol (ROBAXIN) 500 MG tablet Take 1 tablet (500 mg total) by mouth 2 (two) times daily. 12/28/17   Marney Setting, NP  traZODone (DESYREL) 50 MG tablet Take 1 tablet (50 mg total) at bedtime as needed by mouth for sleep. 11/04/17   Money, Lowry Ram, FNP    Family History Family History  Problem Relation Age of Onset  . Diabetes Mellitus II Mother   . Diabetes Mother   . Prostate cancer Father   . Colon cancer Neg Hx     Social History Social History   Tobacco Use  . Smoking status: Never Smoker  . Smokeless tobacco: Never Used  Substance Use Topics  . Alcohol use: No  . Drug use: No     Allergies   Patient has no known allergies.   Review of Systems Review of Systems  Constitutional: Positive for fever.  HENT: Positive for congestion and ear pain.   Eyes: Negative.   Respiratory: Negative.   Cardiovascular: Negative.   Musculoskeletal: Positive for neck pain.  Rt shoulder muscle pain   Skin: Negative.   Neurological: Negative.      Physical Exam Triage Vital Signs ED Triage Vitals [12/28/17 1642]  Enc Vitals Group     BP 121/60     Pulse Rate 90     Resp 16     Temp 99.1 F (37.3 C)     Temp Source Oral     SpO2 100 %     Weight 145 lb (65.8 kg)     Height      Head Circumference      Peak Flow      Pain Score 9     Pain Loc      Pain Edu?      Excl. in [place]?    No data found.  Updated Vital Signs BP 121/60   Pulse 90   Temp 99.1 F (37.3 C) (Oral)   Resp 16   Wt 145 lb (65.8 kg)   LMP 11/02/2017   SpO2 100%   Breastfeeding? Unknown Comment: miscarriage 12/20/17  BMI 25.69 kg/m   Visual Acuity Right Eye Distance:   Left Eye Distance:   Bilateral Distance:    Right Eye Near:   Left Eye Near:    Bilateral Near:     Physical Exam  Constitutional: She appears well-developed.  HENT:  Head: Normocephalic.  Lt ear  erythema, buldging,   Eyes: Pupils are equal, round, and reactive to light.  Neck: Normal range of motion.  Cardiovascular: Normal rate and regular rhythm.  Pulmonary/Chest: Effort normal and breath sounds normal.  Abdominal: Soft. Bowel sounds are normal.  Neurological: She is alert.  Skin: Skin is warm. Capillary refill takes less than 2 seconds.     UC Treatments / Results  Labs (all labs ordered are listed, but only abnormal results are displayed) Labs Reviewed - No data to display  EKG  EKG Interpretation None       Radiology No results found.  Procedures Procedures (including critical care time)  Medications Ordered in UC Medications  ketorolac (TORADOL) 30 MG/ML injection 30 mg (not administered)     Initial Impression / Assessment and Plan / UC Course  I have reviewed the triage vital signs and the nursing notes.  Pertinent labs & imaging results that were available during my care of the patient were reviewed by me and considered in my medical decision making (see chart for details).    You may use the naproxen for pain as needed  May use a heating pad or warm water on the muscle  Avoid driving   Final Clinical Impressions(s) / UC Diagnoses   Final diagnoses:  Neck pain  Acute mucoid otitis media of both ears  Muscle ache    ED Discharge Orders        Ordered    methocarbamol (ROBAXIN) 500 MG tablet  2 times daily     12/28/17 1716    amoxicillin (AMOXIL) 500 MG capsule  3 times daily     12/28/17 1716       Controlled Substance Prescriptions [place] Controlled Substance Registry consulted? Yes, I have consulted the [place] Controlled Substances Registry for this patient, and feel the risk/benefit ratio today is favorable for proceeding with this prescription for a controlled substance.   Marney Setting, NP 12/28/17 1721

## 2017-12-28 NOTE — ED Triage Notes (Signed)
PT reports congestion, cough, and ear pain for 1 week. PT reports she woke up with neck stiffness and pain.

## 2017-12-28 NOTE — Discharge Instructions (Signed)
You may use the naproxen for pain as needed  May use a heating pad or warm water on the muscle  Avoid driving while taking muscle relaxer

## 2018-10-07 ENCOUNTER — Emergency Department (HOSPITAL_COMMUNITY)
Admission: EM | Admit: 2018-10-07 | Discharge: 2018-10-07 | Disposition: A | Discharge status: Home / Self Care | Payer: Medicaid Other | Attending: Emergency Medicine | Admitting: Emergency Medicine

## 2018-10-07 ENCOUNTER — Other Ambulatory Visit: Payer: Self-pay

## 2018-10-07 DIAGNOSIS — G253 Myoclonus: Secondary | ICD-10-CM | POA: Insufficient documentation

## 2018-10-07 DIAGNOSIS — Z79899 Other long term (current) drug therapy: Secondary | ICD-10-CM | POA: Insufficient documentation

## 2018-10-07 LAB — COMPREHENSIVE METABOLIC PANEL
ALK PHOS: 62 U/L (ref 38–126)
ALT: 16 U/L (ref 0–44)
ANION GAP: 10 (ref 5–15)
AST: 23 U/L (ref 15–41)
Albumin: 3.6 g/dL (ref 3.5–5.0)
BILIRUBIN TOTAL: 0.6 mg/dL (ref 0.3–1.2)
BUN: 24 mg/dL — ABNORMAL HIGH (ref 6–20)
CALCIUM: 9.1 mg/dL (ref 8.9–10.3)
CO2: 21 mmol/L — AB (ref 22–32)
Chloride: 108 mmol/L (ref 98–111)
Creatinine, Ser: 0.95 mg/dL (ref 0.44–1.00)
Glucose, Bld: 99 mg/dL (ref 70–99)
Potassium: 4.5 mmol/L (ref 3.5–5.1)
Sodium: 139 mmol/L (ref 135–145)
TOTAL PROTEIN: 6.4 g/dL — AB (ref 6.5–8.1)

## 2018-10-07 LAB — SALICYLATE LEVEL

## 2018-10-07 LAB — CBC WITH DIFFERENTIAL/PLATELET
ABS IMMATURE GRANULOCYTES: 0.04 10*3/uL (ref 0.00–0.07)
Basophils Absolute: 0 10*3/uL (ref 0.0–0.1)
Basophils Relative: 0 %
EOS PCT: 2 %
Eosinophils Absolute: 0.2 10*3/uL (ref 0.0–0.5)
HEMATOCRIT: 36.4 % (ref 36.0–46.0)
HEMOGLOBIN: 11.6 g/dL — AB (ref 12.0–15.0)
Immature Granulocytes: 0 %
LYMPHS PCT: 26 %
Lymphs Abs: 2.6 10*3/uL (ref 0.7–4.0)
MCH: 27 pg (ref 26.0–34.0)
MCHC: 31.9 g/dL (ref 30.0–36.0)
MCV: 84.7 fL (ref 80.0–100.0)
MONO ABS: 0.5 10*3/uL (ref 0.1–1.0)
MONOS PCT: 5 %
NEUTROS ABS: 6.5 10*3/uL (ref 1.7–7.7)
Neutrophils Relative %: 67 %
Platelets: 385 10*3/uL (ref 150–400)
RBC: 4.3 MIL/uL (ref 3.87–5.11)
RDW: 14 % (ref 11.5–15.5)
WBC: 9.9 10*3/uL (ref 4.0–10.5)
nRBC: 0 % (ref 0.0–0.2)

## 2018-10-07 LAB — POC URINE PREG, ED: PREG TEST UR: NEGATIVE

## 2018-10-07 LAB — ACETAMINOPHEN LEVEL

## 2018-10-07 NOTE — ED Provider Notes (Signed)
[facility] DEPT Provider Note   CSN: [id number] Arrival date & time: 10/07/18  1619     History   Chief Complaint Chief Complaint  Patient presents with  . body contractions    HPI Sueko Dimichele is a 42 y.o. female.  HPI   42 year old female with a history of beta depression, salicylate overdose, mushroom poisoning, who presents to the emergency department with concern for whole body jerking movements.  Patient reports she was at the dentist, when she developed whole body jerking.  She is awake during the episodes, when her whole body will jerk, and improves, then returns.  She reports she is had this happen one other time when she had overdosed on salicylates.  Denies any recent medication changes, increases in medication or discontinuing medications.  Reports she has been taking some over-the-counter vitamins, but denies any overdose or misuse.  Denies suicidal ideation. No fevers. Symptoms have improved since she was at the dentist.    Past Medical History:  Diagnosis Date  . Anemia   . Depression   . Medical history non-contributory   . Memory difficulty   . Problems with hearing     Patient Active Problem List   Diagnosis Date Noted  . Severe recurrent major depression without psychotic features ([place]) 10/31/2017  . Salicylate overdose 14/97/0263  . Suicidal behavior 10/30/2017  . Adjustment disorder with mixed disturbance of emotions and conduct   . Moderate episode of recurrent major depressive disorder ([place]) 02/09/2017  . 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 Leucocytosis 09/26/2014    Past Surgical History:  Procedure Laterality Date  . EYE SURGERY    . [place]    x 4 other 3 as a child  . REFRACTIVE SURGERY  07/2014     OB History    Gravida  1   Para  0   Term  0   Preterm  0   AB  0   Living        SAB  0   TAB  0   Ectopic  0   Multiple      Live Births               Home  Medications    Prior to Admission medications   Medication Sig Start Date End Date Taking? Authorizing Provider  FLUoxetine (PROZAC) 20 MG capsule Take 3 capsules (60 mg total) daily by mouth. For mood control Patient taking differently: Take 40 mg by mouth daily. For mood control 11/04/17  Yes Money, Lowry Ram, FNP  Multiple Vitamin (MULTIVITAMIN) tablet Take 1 tablet by mouth daily.   Yes [provider]  Omega-3 Fatty Acids (FISH OIL) 1000 MG CAPS Take 1 capsule by mouth daily.   Yes [provider]  OVER THE COUNTER MEDICATION Take 1 capsule by mouth daily. Stomach medicine that starts with a D she bought over the counter   Yes [provider]    Family History Family History  Problem Relation Age of Onset  . Diabetes Mellitus II Mother   . Diabetes Mother   . Prostate cancer Father   . Colon cancer Neg Hx     Social History Social History   Tobacco Use  . Smoking status: Never Smoker  . Smokeless tobacco: Never Used  Substance Use Topics  . Alcohol use: No  . Drug use: No     Allergies   Patient has no known allergies.   Review of  Systems Review of Systems  Constitutional: Negative for fever.  HENT: Negative for sore throat.   Eyes: Negative for visual disturbance.  Respiratory: Negative for cough and shortness of breath.   Cardiovascular: Negative for chest pain.  Gastrointestinal: Negative for abdominal pain, nausea and vomiting.  Genitourinary: Negative for difficulty urinating.  Musculoskeletal: Negative for back pain and neck pain.  Skin: Negative for rash.  Neurological: Negative for syncope, weakness, numbness and headaches. Tremors: jerking.     Physical Exam Updated Vital Signs BP 126/84 (BP Location: Right Arm)   Pulse 93   Temp 98.7 F (37.1 C) (Oral)   Resp 18   Ht 5\' 2"  (1.575 m)   Wt 68 kg   LMP 11/02/2017   SpO2 98%   BMI 27.44 kg/m   Physical Exam  Constitutional: She is oriented to person, place, and  time. She appears well-developed and well-nourished. No distress.  HENT:  Head: Normocephalic and atraumatic.  Eyes: Conjunctivae and EOM are normal.  Neck: Normal range of motion.  Cardiovascular: Normal rate, regular rhythm, normal heart sounds and intact distal pulses. Exam reveals no gallop and no friction rub.  No murmur heard. Pulmonary/Chest: Effort normal and breath sounds normal. No respiratory distress. She has no wheezes. She has no rales.  Abdominal: Soft. She exhibits no distension. There is no tenderness. There is no guarding.  Musculoskeletal: She exhibits no edema or tenderness.  Neurological: She is alert and oriented to person, place, and time.  Skin: Skin is warm and dry. No rash noted. She is not diaphoretic. No erythema.  Nursing note and vitals reviewed.    ED Treatments / Results  Labs (all labs ordered are listed, but only abnormal results are displayed) Labs Reviewed  CBC WITH DIFFERENTIAL/PLATELET - Abnormal; Notable for the following components:      Result Value   Hemoglobin 11.6 (*)    All other components within normal limits  COMPREHENSIVE METABOLIC PANEL - Abnormal; Notable for the following components:   CO2 21 (*)    BUN 24 (*)    Total Protein 6.4 (*)    All other components within normal limits  ACETAMINOPHEN LEVEL - Abnormal; Notable for the following components:   Acetaminophen (Tylenol), Serum <10 (*)    All other components within normal limits  SALICYLATE LEVEL  I-STAT BETA HCG BLOOD, ED ([facility], [facility], AP ONLY)  POC URINE PREG, ED    EKG EKG Interpretation  Date/Time:  Thursday October 07 2018 16:45:37 EDT Ventricular Rate:  82 PR Interval:    QRS Duration: 98 QT Interval:  380 QTC Calculation: 444 R Axis:   78 Text Interpretation:  Sinus rhythm Low voltage, precordial leads No significant change since last tracing Confirmed by Gareth Morgan [phone]) on 10/07/2018 8:00:30 PM   Radiology No results found.  Procedures Procedures  (including critical care time)  Medications Ordered in ED Medications - No data to display   Initial Impression / Assessment and Plan / ED Course  I have reviewed the triage vital signs and the nursing notes.  Pertinent labs & imaging results that were available during my care of the patient were reviewed by me and considered in my medical decision making (see chart for details).     42 year old female with a history of beta depression, salicylate overdose, mushroom poisoning, who presents to the emergency department with concern for whole body jerking movements.  Given patient aware during episodes, they are not consistent with generalized seizures. Most consistent with myoclonic jerks.  No sign of underlying infection, hypoxia, no sign of ingestion. No sign of serotonin syndrome. Do not detect medications on her list as a cause. She has had similar symptoms before at time of stress/ASA overdose. Doubt prion disease.  No significant electrolyte abnormality.  Feel patient is appropriate for outpatient work up. Recommend neurology follow up. Patient discharged in stable condition with understanding of reasons to return.   Final Clinical Impressions(s) / ED Diagnoses   Final diagnoses:  Myoclonus    ED Discharge Orders    None       Gareth Morgan, MD 10/08/18 0157

## 2018-10-07 NOTE — ED Triage Notes (Signed)
Pt is having un-voluntary contractions. Pt spouse and dtr are at bedside. Pt is able to communicate during episodes

## 2018-10-07 NOTE — ED Notes (Signed)
Bed: WA01 Expected date:  Expected time:  Means of arrival:  Comments: EMS-seizure 

## 2018-10-07 NOTE — ED Triage Notes (Signed)
Pt arrived via EMS from Dental office.Pt was getting dental cleaning and began convulsing. Per witnesses pt had a full body contraction, no loc.  No changes in breathing pattern. 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 Per EMS states that pt had intermittent episodes en route but did not appear to be typical SZ like activity. .  EMS v/s 130/88, HR 92, RR 16, 95% RA

## 2018-10-08 ENCOUNTER — Encounter: Payer: Self-pay | Admitting: Neurology

## 2018-10-19 NOTE — Progress Notes (Signed)
Angela Romero was seen today in neurologic consultation at the request of the ER The consultation is for the evaluation of myoclonus.  ER records are reviewed.  Pt with her husband who supplements the history.  The patient is a 42 y.o. year old female with a history of depression and history of overdose who presented to the emergency room with complaints of whole body jerking.  She had been at the dentist when she started jerking. She stated that it started as soon as they put the numbing medication in her mouth.  It was initially 3-4 jerks in an hour and lasted in total a few hours.  She could not control the movements.  It would involve the arms and legs.   Unable to complete dental examination because of it.  She reported to the emergency room that the only other time this happened is when she overdosed on salicylates.  This was 1 year ago.  Very little neuro exam is noted in the ER note and no description is made of movements.  Patient had basic labs and was sent home from the emergency room and told to follow-up here.    Patient was seen by [facility] neurology in 2013.  She had a normal MRI of the brain.  No neurology records are available from Dr. Leta Baptist, but she has been seeing Dr Dionne Milo from neuropsychology at [facility].  She last saw him in August, 2018.  She had neurocognitive testing.  Unfortunately, her test was invalid due to "poor or fluctuating effort."  It was felt that the patient was giving minimum ability.  It was felt that the patients memory change was due to "psychiatric distress."  Pt asks today about her bad memory.  "I don't know what to do."  Went to counseling years ago but quit going.   ALLERGIES:  No Known Allergies  CURRENT MEDICATIONS:  Outpatient Encounter Medications as of 10/20/2018  Medication Sig  . FLUoxetine (PROZAC) 20 MG capsule Take 3 capsules (60 mg total) daily by mouth. For mood control (Patient taking differently: Take 40 mg by mouth daily. For mood  control)  . Multiple Vitamin (MULTIVITAMIN) tablet Take 1 tablet by mouth daily.  . Omega-3 Fatty Acids (FISH OIL) 1000 MG CAPS Take 1 capsule by mouth daily.  Marland Kitchen OVER THE COUNTER MEDICATION Take 1 capsule by mouth daily. Stomach medicine that starts with a D she bought over the counter   No facility-administered encounter medications on file as of 10/20/2018.     PAST MEDICAL HISTORY:   Past Medical History:  Diagnosis Date  . Anemia   . Depression   . Medical history non-contributory   . Memory difficulty   . Problems with hearing     PAST SURGICAL HISTORY:   Past Surgical History:  Procedure Laterality Date  . EYE SURGERY    . [place]    x 4 other 3 as a child  . REFRACTIVE SURGERY  07/2014    SOCIAL HISTORY:   Social History   Socioeconomic History  . Marital status: Unknown    Spouse name: Not on file  . Number of children: 3  . Years of education: Not on file  . Highest education level: Not on file  Occupational History  . Occupation: vendor    Comment: hot dog  Social Needs  . Financial resource strain: Not on file  . Food insecurity:    Worry: Not on file    Inability: Not on  file  . Transportation needs:    Medical: Not on file    Non-medical: Not on file  Tobacco Use  . Smoking status: Current Some Day Smoker  . Smokeless tobacco: Never Used  Substance and Sexual Activity  . Alcohol use: Yes    Comment: 1 time per month  . Drug use: No  . Sexual activity: Yes    Birth control/protection: None  Lifestyle  . Physical activity:    Days per week: Not on file    Minutes per session: Not on file  . Stress: Not on file  Relationships  . Social connections:    Talks on phone: Not on file    Gets together: Not on file    Attends religious service: Not on file    Active member of club or organization: Not on file    Attends meetings of clubs or organizations: Not on file    Relationship status: Not on file  . Intimate partner  violence:    Fear of current or ex partner: Not on file    Emotionally abused: Not on file    Physically abused: Not on file    Forced sexual activity: Not on file  Other Topics Concern  . Not on file  Social History Narrative   ** Merged History Encounter **        FAMILY HISTORY:   Family Status  Relation Name Status  . Mother  Deceased  . Father  Deceased  . Brother  Alive  . Child 3 Alive  . Neg Hx  (Not Specified)    ROS:  Review of Systems  Constitutional: Positive for malaise/fatigue (husband states that she sleeps 20 hours per day).  HENT: Negative.   Eyes: Negative.   Respiratory: Negative.   Cardiovascular: Negative.   Gastrointestinal: Negative.   Skin: Negative.   Endo/Heme/Allergies: Negative.   Psychiatric/Behavioral: Positive for depression.    PHYSICAL EXAMINATION:    VITALS:   Vitals:   10/20/18 0904  BP: 108/70  Pulse: 77  SpO2: 97%  Weight: 153 lb 6 oz (69.6 kg)  Height: 5\' 2"  (1.575 m)    GEN:  Normal appears female in no acute distress.  Appears stated age.  Affect is flat. HEENT:  Normocephalic, atraumatic. The mucous membranes are moist. The superficial temporal arteries are without ropiness or tenderness. Cardiovascular: Regular rate and rhythm. Lungs: Clear to auscultation bilaterally. Neck/Heme: There are no carotid bruits noted bilaterally.  NEUROLOGICAL: Orientation:  The patient is alert and oriented x 3.  Fund of knowledge is appropriate.  Recent and remote memory intact.  Attention span and concentration normal.  Repeats and names without difficulty. Cranial nerves: There is good facial symmetry. The pupils are equal round and reactive to light bilaterally. Fundoscopic exam reveals clear disc margins bilaterally. Extraocular muscles are intact and visual fields are full to confrontational testing. Speech is fluent and clear. Soft palate rises symmetrically and there is no tongue deviation. Hearing is intact to conversational  tone. Tone: Tone is good throughout. Sensation: Sensation is intact to light touch and pinprick throughout (facial, trunk, extremities). Vibration is intact at the bilateral big toe. There is no extinction with double simultaneous stimulation. There is no sensory dermatomal level identified. Coordination:  The patient has no difficulty with RAM's or FNF bilaterally. Motor: Strength is 5/5 in the bilateral upper and lower extremities.  Shoulder shrug is equal and symmetric. There is no pronator drift.  There are no fasciculations noted. DTR's: Deep tendon  reflexes are 2/4 at the bilateral biceps, triceps, brachioradialis, patella and achilles.  After hitting each reflex, she subsequently has a nonphysiologic, delayed entire body jerk.  This is delayed by approximately 2 seconds after having the physiologic reflex.  Her husband then states, "this is the movement we saw."  Plantar responses are downgoing bilaterally. Gait and Station: The patient is able to ambulate without difficulty. The patient is able to heel toe walk without any difficulty. The patient is able to ambulate in a tandem fashion. The patient is able to stand in the Romberg position.    Chemistry      Component Value Date/Time   NA 139 10/07/2018 1844   K 4.5 10/07/2018 1844   CL 108 10/07/2018 1844   CO2 21 (L) 10/07/2018 1844   BUN 24 (H) 10/07/2018 1844   CREATININE 0.95 10/07/2018 1844      Component Value Date/Time   CALCIUM 9.1 10/07/2018 1844   ALKPHOS 62 10/07/2018 1844   AST 23 10/07/2018 1844   ALT 16 10/07/2018 1844   BILITOT 0.6 10/07/2018 1844     Lab Results  Component Value Date   TSH 3.808 11/01/2017   Lab Results  Component Value Date   WBC 9.9 10/07/2018   HGB 11.6 (L) 10/07/2018   HCT 36.4 10/07/2018   MCV 84.7 10/07/2018   PLT 385 10/07/2018      IMPRESSION/PLAN  1.  Probable anxiety with subsequent nonphysiologic movements  -As above, after hitting the patient's reflexes, she had a  physiologic reflex and then a delayed nonphysiologic response.  Her husband states that this was the exact response that she had at the dentist office after having the injections of lidocaine.  I explained to the patient's husband and the patient that this would not have been a result of lidocaine and that the lidocaine would have not gone throughout the entire body, or it would have potentially affected her heart rhythm.  I asked her if she thought that her response at the dentist was a result of anxiety and she admitted that it potentially was.  She very much would like to have her dental work completed.  My suggestion would be to have her primary care physician give her something to help relax her at the dentist.  We will do a few labs just to make sure we are not missing anything else, but based on exam today, this is doubtful.  -will recheck TSH  -will check ammonia  -will recheck urinary copper, serum ceruloplasmin  2.  Memory loss  -This has been worked up in the past.  She has had neurocognitive testing, but results were invalid because of poor effort.  She was told that her memory loss is likely due to stress.  I told her that this was the most common cause of memory change in her age group.  I recommended counseling.  She was told this in the past as well.  She had actually received counseling in the past, but quit going.  Information to counseling services was given to her today.  I saw no evidence of a neurodegenerative process, especially given that she has reported this memory loss for many years.  3.  patient does not need to follow-up in the neurology clinic.  Neuro exam non focal and non lateralizing.  Will call her with lab results.  F/u with PCP.   Cc:  [facility] [employer]

## 2018-10-20 ENCOUNTER — Other Ambulatory Visit (INDEPENDENT_AMBULATORY_CARE_PROVIDER_SITE_OTHER): Payer: Medicaid Other

## 2018-10-20 ENCOUNTER — Ambulatory Visit: Payer: Self-pay | Admitting: Neurology

## 2018-10-20 ENCOUNTER — Encounter: Payer: Self-pay | Admitting: Neurology

## 2018-10-20 ENCOUNTER — Ambulatory Visit (INDEPENDENT_AMBULATORY_CARE_PROVIDER_SITE_OTHER): Payer: Medicaid Other | Admitting: Neurology

## 2018-10-20 VITALS — BP 108/70 | HR 77 | Ht 62.0 in | Wt 153.4 lb

## 2018-10-20 DIAGNOSIS — R413 Other amnesia: Secondary | ICD-10-CM | POA: Diagnosis not present

## 2018-10-20 DIAGNOSIS — F331 Major depressive disorder, recurrent, moderate: Secondary | ICD-10-CM

## 2018-10-20 DIAGNOSIS — R259 Unspecified abnormal involuntary movements: Secondary | ICD-10-CM

## 2018-10-20 DIAGNOSIS — G259 Extrapyramidal and movement disorder, unspecified: Secondary | ICD-10-CM

## 2018-10-20 LAB — AMMONIA: Ammonia: 39 umol/L — ABNORMAL HIGH (ref 11–35)

## 2018-10-20 LAB — TSH: TSH: 3.43 u[IU]/mL (ref 0.35–4.50)

## 2018-10-21 LAB — CERULOPLASMIN: CERULOPLASMIN: 38 mg/dL (ref 18–53)

## 2018-10-22 ENCOUNTER — Telehealth: Payer: Self-pay | Admitting: Neurology

## 2018-10-22 LAB — COPPER, URINE - RANDOM OR 24 HOUR
COPPER UR: 30 ug/L
Copper / Creatinine Ratio: 11 ug/g creat (ref 0–49)
Creatinine(Crt),U: 2.76 g/L (ref 0.30–3.00)

## 2018-10-22 NOTE — Telephone Encounter (Signed)
-----   Message from [place], DO sent at 10/22/2018  7:45 AM EDT ----- I have reviewed all lab results which are normal or stable. Please inform the patient.

## 2018-10-22 NOTE — Telephone Encounter (Signed)
Tried to call the patient. No answer and voicemail set up. Letter sent to patient.

## 2019-02-16 ENCOUNTER — Telehealth: Payer: Self-pay | Admitting: Neurology

## 2019-02-16 NOTE — Telephone Encounter (Signed)
Patient came by the office and signed the release. Clearance and office note faxed to [phone] with confirmation received.

## 2019-02-16 NOTE — Telephone Encounter (Signed)
Received request for clearance for patient to have surgery tomorrow from [facility]. Dr. Carles Collet signed, but was need signed release from patient prior to sending this to facility. Spoke with Claiborne Billings at [facility] and made her aware. She will contact patient.

## 2019-04-15 ENCOUNTER — Ambulatory Visit (HOSPITAL_COMMUNITY)
Admission: EM | Admit: 2019-04-15 | Discharge: 2019-04-15 | Disposition: A | Discharge status: Home / Self Care | Payer: Medicaid Other

## 2019-04-15 ENCOUNTER — Emergency Department (HOSPITAL_COMMUNITY)
Admission: EM | Admit: 2019-04-15 | Discharge: 2019-04-15 | Disposition: A | Discharge status: Home / Self Care | Payer: Medicaid Other | Attending: Emergency Medicine | Admitting: Emergency Medicine

## 2019-04-15 ENCOUNTER — Emergency Department (HOSPITAL_COMMUNITY): Payer: Medicaid Other

## 2019-04-15 ENCOUNTER — Encounter (HOSPITAL_COMMUNITY): Payer: Self-pay | Admitting: Emergency Medicine

## 2019-04-15 ENCOUNTER — Other Ambulatory Visit: Payer: Self-pay

## 2019-04-15 DIAGNOSIS — R072 Precordial pain: Secondary | ICD-10-CM

## 2019-04-15 DIAGNOSIS — F17210 Nicotine dependence, cigarettes, uncomplicated: Secondary | ICD-10-CM | POA: Insufficient documentation

## 2019-04-15 DIAGNOSIS — F419 Anxiety disorder, unspecified: Secondary | ICD-10-CM | POA: Insufficient documentation

## 2019-04-15 DIAGNOSIS — Z79899 Other long term (current) drug therapy: Secondary | ICD-10-CM | POA: Insufficient documentation

## 2019-04-15 DIAGNOSIS — R252 Cramp and spasm: Secondary | ICD-10-CM

## 2019-04-15 LAB — BASIC METABOLIC PANEL
Anion gap: 11 (ref 5–15)
BUN: 11 mg/dL (ref 6–20)
CO2: 23 mmol/L (ref 22–32)
Calcium: 9.4 mg/dL (ref 8.9–10.3)
Chloride: 105 mmol/L (ref 98–111)
Creatinine, Ser: 0.64 mg/dL (ref 0.44–1.00)
GFR calc Af Amer: >60 mL/min (ref >60)
GFR calc non Af Amer: >60 mL/min (ref >60)
Glucose, Bld: 116 mg/dL — ABNORMAL HIGH (ref 70–99)
Potassium: 4.1 mmol/L (ref 3.5–5.1)
Sodium: 139 mmol/L (ref 135–145)

## 2019-04-15 LAB — I-STAT BETA HCG BLOOD, ED (MC, WL, AP ONLY): I-stat hCG, quantitative: <5 m[IU]/mL (ref <5)

## 2019-04-15 LAB — CBC
HCT: 35.3 % — ABNORMAL LOW (ref 36.0–46.0)
Hemoglobin: 10.3 g/dL — ABNORMAL LOW (ref 12.0–15.0)
MCH: 22 pg — ABNORMAL LOW (ref 26.0–34.0)
MCHC: 29.2 g/dL — ABNORMAL LOW (ref 30.0–36.0)
MCV: 75.4 fL — ABNORMAL LOW (ref 80.0–100.0)
Platelets: 460 10*3/uL — ABNORMAL HIGH (ref 150–400)
RBC: 4.68 MIL/uL (ref 3.87–5.11)
RDW: 14.1 % (ref 11.5–15.5)
WBC: 6 10*3/uL (ref 4.0–10.5)
nRBC: 0 % (ref 0.0–0.2)

## 2019-04-15 LAB — TROPONIN I
Troponin I: <0.03 ng/mL (ref <0.03)
Troponin I: <0.03 ng/mL (ref <0.03)

## 2019-04-15 MED ORDER — SODIUM CHLORIDE 0.9% FLUSH: 3.0000 mL | Freq: Once | INTRAVENOUS | Status: DC

## 2019-04-15 NOTE — ED Triage Notes (Signed)
Patient reports sharp left sided chest pain that began this morning. Denies sob, diaphoresis but endorses nausea. Patient also reports that she often "jumps" in her sleep x1 year.

## 2019-04-15 NOTE — ED Notes (Signed)
Pt sent to ER by Levada Dy, RN

## 2019-04-15 NOTE — ED Provider Notes (Signed)
[facility] EMERGENCY DEPARTMENT Provider Note   CSN: [id number] Arrival date & time: 04/15/19  1419    History   Chief Complaint Chief Complaint  Patient presents with  . Chest Pain    HPI Angela Romero is a 43 y.o. female.     HPI A 43 year old patient with a history of obesity presents for evaluation of chest pain. Initial onset of pain was more than 6 hours ago. The patient's chest pain is well-localized, is sharp and is not worse with exertion. The patient's chest pain is not middle- or left-sided, is not described as heaviness/pressure/tightness and does not radiate to the arms/jaw/neck. The patient does not complain of nausea and denies diaphoresis. The patient has no history of stroke, has no history of peripheral artery disease, has not smoked in the past 90 days, denies any history of treated diabetes, has no relevant family history of coronary artery disease (first degree relative at less than age 57), is not hypertensive and has no history of hypercholesterolemia.  Past Medical History:  Diagnosis Date  . Anemia   . Depression   . Medical history non-contributory   . Memory difficulty   . Problems with hearing     Patient Active Problem List   Diagnosis Date Noted  . Severe recurrent major depression without psychotic features ([place]) 10/31/2017  . Salicylate overdose 14/43/1540  . Suicidal behavior 10/30/2017  . Adjustment disorder with mixed disturbance of emotions and conduct   . Moderate episode of recurrent major depressive disorder ([place]) 02/09/2017  . 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 Leucocytosis 09/26/2014    Past Surgical History:  Procedure Laterality Date  . EYE SURGERY    . [place]    x 4 other 3 as a child  . REFRACTIVE SURGERY  07/2014     OB History    Gravida  1   Para  0   Term  0   Preterm  0   AB  0   Living        SAB  0   TAB  0   Ectopic  0   Multiple      Live Births               Home Medications    Prior to Admission medications   Medication Sig Start Date End Date Taking? Authorizing Provider  FLUoxetine (PROZAC) 20 MG capsule Take 3 capsules (60 mg total) daily by mouth. For mood control Patient taking differently: Take 40 mg by mouth daily. For mood control 11/04/17   Money, Darnelle Maffucci B, FNP  Multiple Vitamin (MULTIVITAMIN) tablet Take 1 tablet by mouth daily.    [provider]  Omega-3 Fatty Acids (FISH OIL) 1000 MG CAPS Take 1 capsule by mouth daily.    [provider]  OVER THE COUNTER MEDICATION Take 1 capsule by mouth daily. Stomach medicine that starts with a D she bought over the counter    [provider]    Family History Family History  Problem Relation Age of Onset  . Diabetes Mellitus II Mother   . Prostate cancer Father   . Depression Child   . Colon cancer Neg Hx     Social History Social History   Tobacco Use  . Smoking status: Current Some Day Smoker  . Smokeless tobacco: Never Used  Substance Use Topics  . Alcohol use: Yes    Comment: 1 time per month  . Drug use: No  Allergies   Patient has no known allergies.   Review of Systems Review of Systems  Constitutional: Negative for chills and fever.  HENT: Negative for ear pain and sore throat.   Eyes: Negative for pain and visual disturbance.  Respiratory: Negative for cough and shortness of breath.   Cardiovascular: Positive for chest pain. Negative for palpitations.  Gastrointestinal: Negative for abdominal pain and vomiting.  Genitourinary: Negative for dysuria and hematuria.  Musculoskeletal: Negative for arthralgias and back pain.  Skin: Negative for color change and rash.  Neurological: Negative for seizures and syncope.  Psychiatric/Behavioral: Negative for agitation, confusion, self-injury and suicidal ideas. The patient is nervous/anxious.   All other systems reviewed and are negative.    Physical Exam Updated Vital Signs  BP 120/76   Pulse 92   Temp 98.6 F (37 C) (Oral)   Resp 15   LMP 03/04/2019 (Exact Date)   SpO2 96%   Physical Exam Vitals signs and nursing note reviewed.  Constitutional:      General: She is not in acute distress.    Appearance: She is well-developed.  HENT:     Head: Normocephalic and atraumatic.  Eyes:     Extraocular Movements: Extraocular movements intact.     Conjunctiva/sclera: Conjunctivae normal.     Pupils: Pupils are equal, round, and reactive to light.  Neck:     Musculoskeletal: Neck supple.  Cardiovascular:     Rate and Rhythm: Normal rate and regular rhythm.     Heart sounds: No murmur.  Pulmonary:     Effort: Pulmonary effort is normal. No respiratory distress.     Breath sounds: Normal breath sounds.  Abdominal:     Palpations: Abdomen is soft.     Tenderness: There is no abdominal tenderness.  Musculoskeletal: Normal range of motion.     Right lower leg: She exhibits no tenderness. No edema.     Left lower leg: She exhibits no tenderness. No edema.  Skin:    General: Skin is warm and dry.  Neurological:     General: No focal deficit present.     Mental Status: She is alert.     Cranial Nerves: No cranial nerve deficit.     Comments: Cranial nerves II through XII bilaterally intact.  Romberg sign negative.  Gait without ataxia.  5 out of 5 strength in bilateral upper and lower extremities bilaterally.  No loss of light touch sensation in the body.  GCS 15.  Periodic full body jerking movements.  Distractible.  Increase in frequency during physical examination.  Psychiatric:        Mood and Affect: Mood normal.      ED Treatments / Results  Labs (all labs ordered are listed, but only abnormal results are displayed) Labs Reviewed  BASIC METABOLIC PANEL - Abnormal; Notable for the following components:      Result Value   Glucose, Bld 116 (*)    All other components within normal limits  CBC - Abnormal; Notable for the following components:    Hemoglobin 10.3 (*)    HCT 35.3 (*)    MCV 75.4 (*)    MCH 22.0 (*)    MCHC 29.2 (*)    Platelets 460 (*)    All other components within normal limits  TROPONIN I  I-STAT BETA HCG BLOOD, ED ([facility], [facility], [facility] ONLY)    EKG None  Radiology Dg Chest 2 View  Result Date: 04/15/2019 CLINICAL DATA:  Chest pain EXAM: CHEST - 2  VIEW COMPARISON:  November 14, 2015 FINDINGS: Lungs are clear. Heart size and pulmonary vascularity are normal. No adenopathy. No pneumothorax. No bone lesions. IMPRESSION: No edema or consolidation. Electronically Signed   By: Lowella Grip III M.D.   On: 04/15/2019 15:27    Procedures Procedures (including critical care time)  Medications Ordered in ED Medications  sodium chloride flush (NS) 0.9 % injection 3 mL (has no administration in time range)     Initial Impression / Assessment and Plan / ED Course  I have reviewed the triage vital signs and the nursing notes.  Pertinent labs & imaging results that were available during my care of the patient were reviewed by me and considered in my medical decision making (see chart for details).        Well-appearing hemodynamically stable 43 year old woman with a past medical history of anxiety presents to the emergency department for evaluation of acute onset chest pain approximately 8 hours prior to arrival.  Pain was localized to the left center chest and then disappeared quickly.  No recent trauma to suggest this as a cause.  Vital signs appear to be stable and she is low risk hear score of 1.  Repeat troponin testing negative.  PERC negative.  Unlikely to be PE.  Most likely musculoskeletal pain based on the story.  Atypical chest pain.  No signs of rash to suggest zoster.  Patient does continue to have full body jerking movements which have been well described over the last several years.  Is already had a full neurologic work-up.  Because they are distractible and do not appear to be causing the patient any  distress I believe the patient is stable for discharge at this time with follow-up to PCP.  More likely psychogenic in origin than physiologic.  Final Clinical Impressions(s) / ED Diagnoses   Final diagnoses:  Precordial pain  Jerking movements of extremities    ED Discharge Orders    None       Andee Poles, MD 04/15/19 0315    Lajean Saver, MD 04/15/19 [phone]

## 2019-04-15 NOTE — ED Notes (Signed)
While attempting to get blood from patient, patient had episode of random jerking of limbs, however patient was able to talk to this RN during episode and answered a phone call just after episode ended.

## 2019-09-21 IMAGING — CR CHEST - 2 VIEW
2 series · 2 of 2 positions shown · non-contrast
Comparison: November 14, 2015

CLINICAL DATA: Chest pain

EXAM:
CHEST - 2 VIEW

[chest pa]
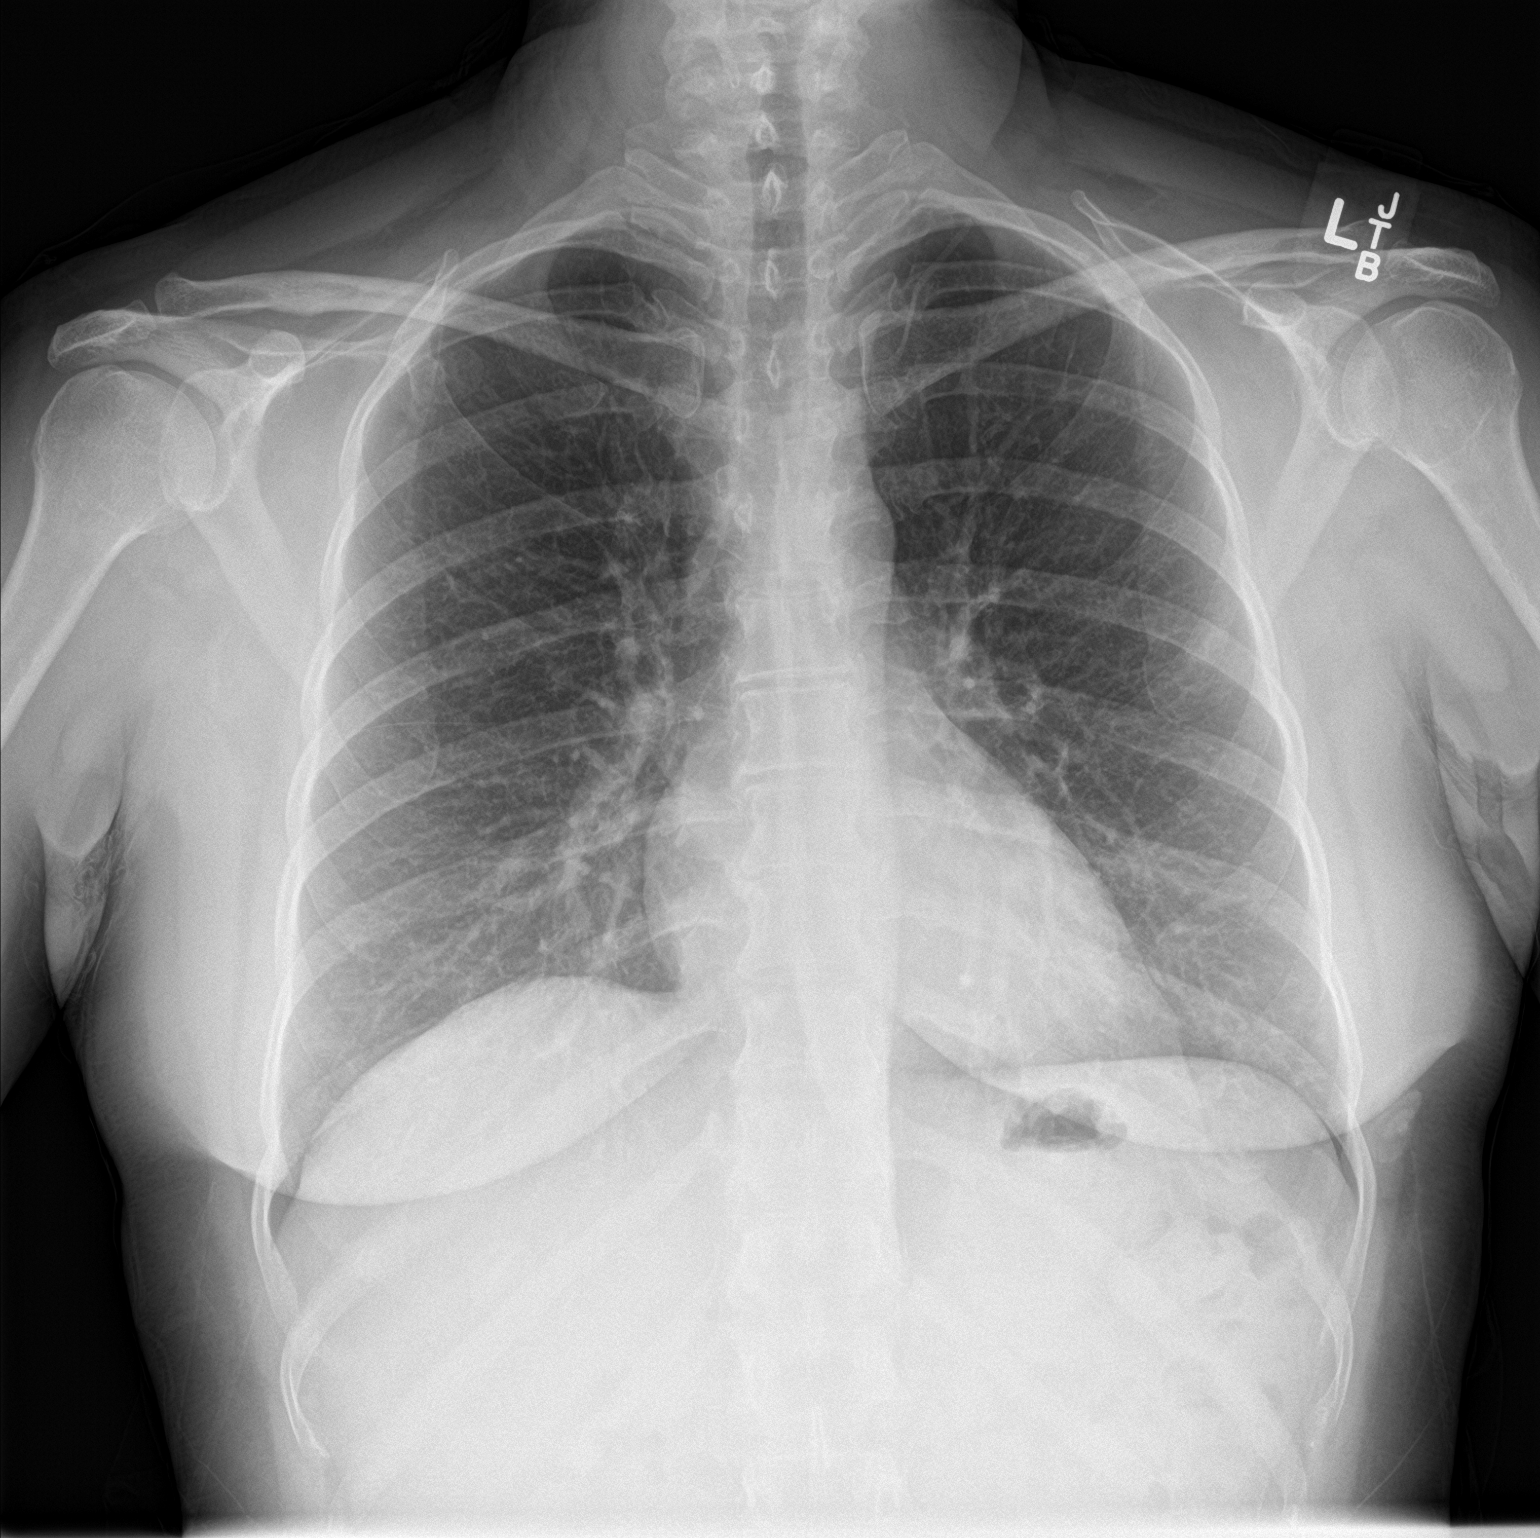

[chest lat]
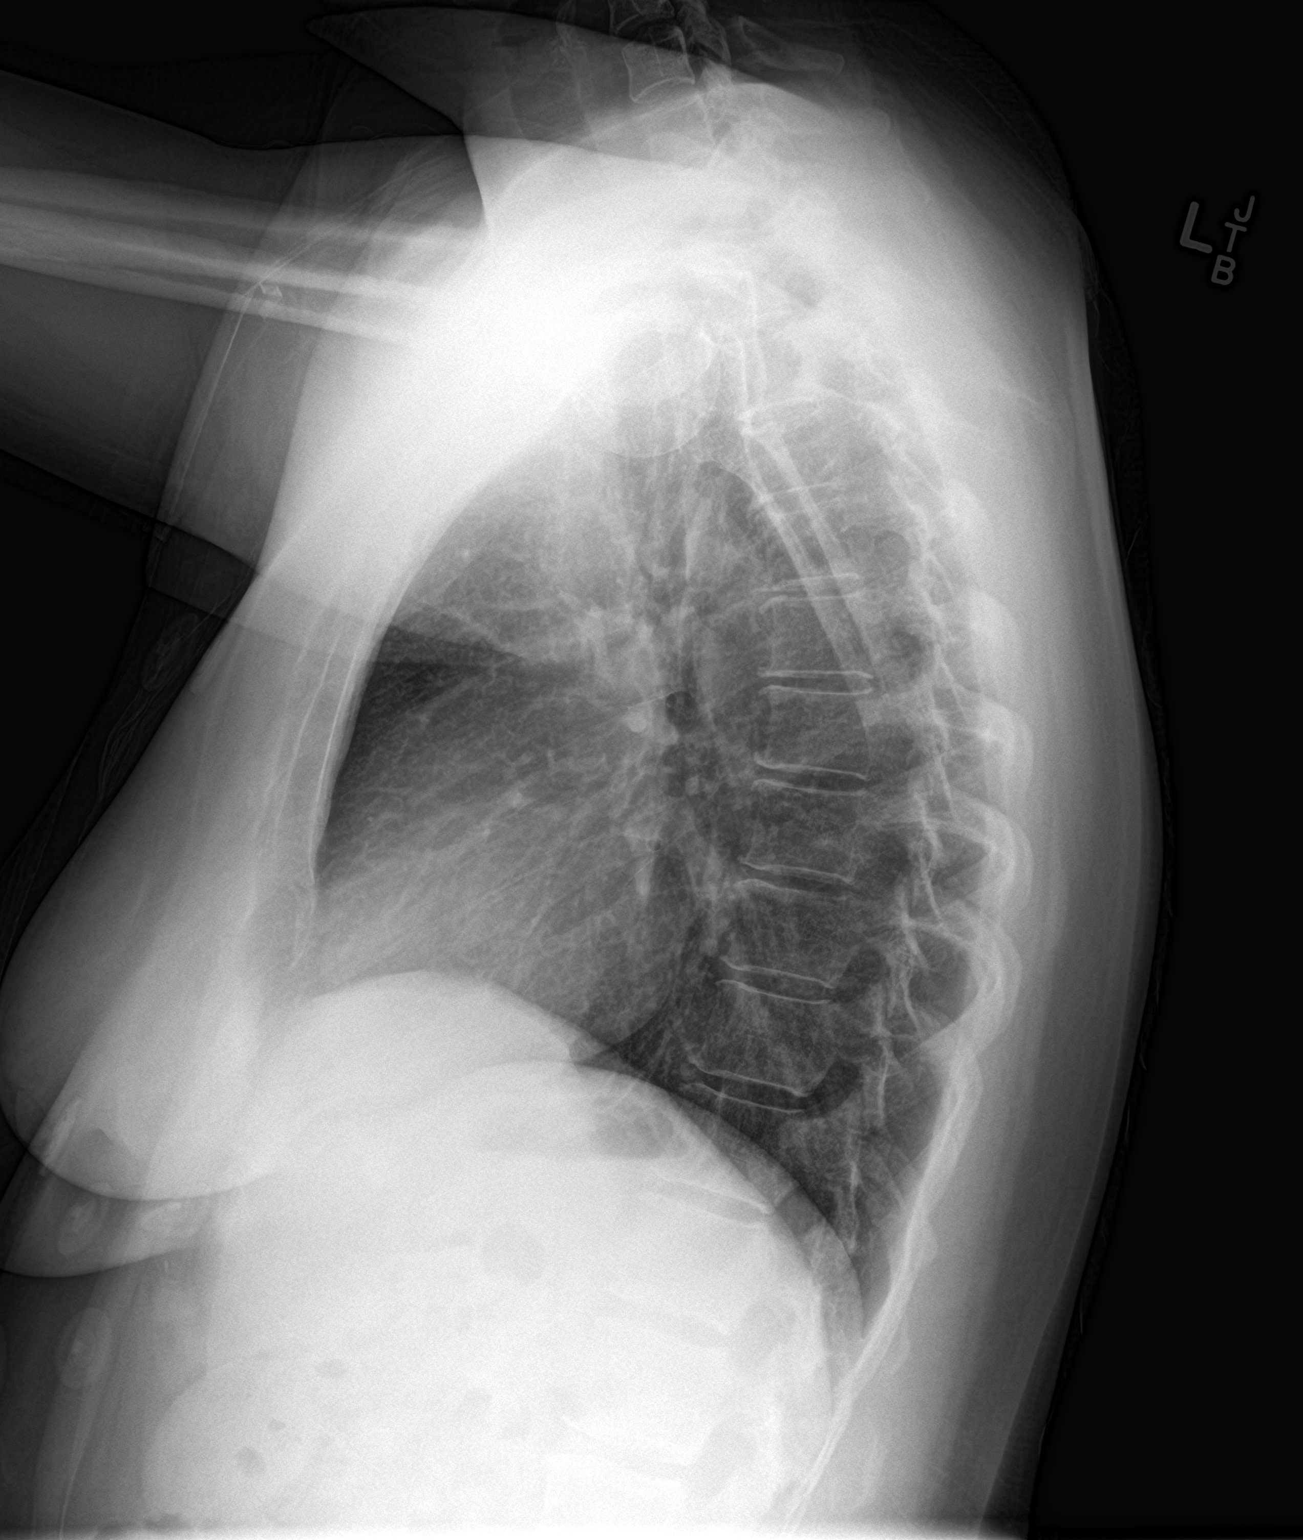

[2 of 2 positions shown; findings below may reference images not displayed]

FINDINGS: Lungs are clear. Heart size and pulmonary vascularity are normal. No
adenopathy. No pneumothorax. No bone lesions.
IMPRESSION: No edema or consolidation.

## 2020-09-03 ENCOUNTER — Other Ambulatory Visit: Payer: Self-pay

## 2020-09-03 ENCOUNTER — Encounter (HOSPITAL_COMMUNITY): Payer: Self-pay | Admitting: Psychiatry

## 2020-09-03 ENCOUNTER — Ambulatory Visit (INDEPENDENT_AMBULATORY_CARE_PROVIDER_SITE_OTHER): Payer: No Payment, Other | Admitting: Psychiatry

## 2020-09-03 DIAGNOSIS — F331 Major depressive disorder, recurrent, moderate: Secondary | ICD-10-CM | POA: Diagnosis not present

## 2020-09-03 DIAGNOSIS — F900 Attention-deficit hyperactivity disorder, predominantly inattentive type: Secondary | ICD-10-CM

## 2020-09-03 DIAGNOSIS — F411 Generalized anxiety disorder: Secondary | ICD-10-CM | POA: Diagnosis not present

## 2020-09-03 MED ORDER — FLUOXETINE HCL 20 MG PO CAPS: 60.0000 mg | ORAL_CAPSULE | Freq: Every day | ORAL | 2 refills | Status: DC

## 2020-09-03 MED ORDER — TRAZODONE HCL 100 MG PO TABS: 100.0000 mg | ORAL_TABLET | Freq: Every day | ORAL | 2 refills | Status: DC

## 2020-09-03 MED ORDER — ATOMOXETINE HCL 40 MG PO CAPS: 40.0000 mg | ORAL_CAPSULE | Freq: Every day | ORAL | 2 refills | Status: DC
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
## 2020-09-18 ENCOUNTER — Encounter (HOSPITAL_COMMUNITY): Payer: Self-pay | Admitting: Licensed Clinical Social Worker

## 2020-09-18 ENCOUNTER — Other Ambulatory Visit: Payer: Self-pay

## 2020-09-18 ENCOUNTER — Ambulatory Visit (INDEPENDENT_AMBULATORY_CARE_PROVIDER_SITE_OTHER): Payer: No Payment, Other | Admitting: Licensed Clinical Social Worker

## 2020-09-18 DIAGNOSIS — F331 Major depressive disorder, recurrent, moderate: Secondary | ICD-10-CM

## 2020-09-18 DIAGNOSIS — F411 Generalized anxiety disorder: Secondary | ICD-10-CM

## 2020-09-18 DIAGNOSIS — F900 Attention-deficit hyperactivity disorder, predominantly inattentive type: Secondary | ICD-10-CM
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
## 2020-10-03 ENCOUNTER — Telehealth (HOSPITAL_COMMUNITY): Payer: Self-pay | Admitting: *Deleted

## 2020-10-03 NOTE — Telephone Encounter (Signed)
VM left for writer stating her new medicine is making her hand shake and wonders if she should stop it. Would like a call back. Will make Eulis Canner NP aware of her concern.

## 2020-10-04 ENCOUNTER — Other Ambulatory Visit (HOSPITAL_COMMUNITY): Payer: Self-pay | Admitting: Psychiatry

## 2020-10-04 DIAGNOSIS — F331 Major depressive disorder, recurrent, moderate: Secondary | ICD-10-CM

## 2020-10-04 DIAGNOSIS — F411 Generalized anxiety disorder: Secondary | ICD-10-CM

## 2020-10-04 MED ORDER — FLUOXETINE HCL 40 MG PO CAPS: 40.0000 mg | ORAL_CAPSULE | Freq: Every day | ORAL | 2 refills | Status: DC

## 2020-10-04 NOTE — Telephone Encounter (Signed)
Writer called patient and was informed that since medication adjustments were made she has had increased hand tremors.  Provider informed patient that tremors are a side effect of Prozac.  Patient is agreeable to reduce Prozac 60 mg to 40 mg to see if challenge improve.  Patient also instructed to follow-up with her PCP for further evaluation.  She endorsed understanding and agreed.

## 2020-10-11 ENCOUNTER — Other Ambulatory Visit: Payer: Self-pay

## 2020-10-11 ENCOUNTER — Ambulatory Visit (INDEPENDENT_AMBULATORY_CARE_PROVIDER_SITE_OTHER): Payer: No Payment, Other | Admitting: Licensed Clinical Social Worker

## 2020-10-11 DIAGNOSIS — F331 Major depressive disorder, recurrent, moderate: Secondary | ICD-10-CM
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
## 2020-11-12 ENCOUNTER — Other Ambulatory Visit: Payer: Self-pay

## 2020-11-12 ENCOUNTER — Ambulatory Visit (INDEPENDENT_AMBULATORY_CARE_PROVIDER_SITE_OTHER): Payer: No Payment, Other | Admitting: Licensed Clinical Social Worker

## 2020-11-12 DIAGNOSIS — F411 Generalized anxiety disorder: Secondary | ICD-10-CM | POA: Diagnosis not present

## 2020-11-12 DIAGNOSIS — F331 Major depressive disorder, recurrent, moderate: Secondary | ICD-10-CM

## 2020-11-12 NOTE — Progress Notes (Signed)
   THERAPIST PROGRESS NOTE  Session Time: 22   Therapist Response:    Subjective/Objective:  Pt was alert and oriented x 5. She was dressed casually and engaged well throughout therapy session. Pt presented with flat and depressed mood/affect. Estefania was cooperative and maintained good eye contact.   Pt primary stressor today is family conflict. Kya states that her children talk down to her and that makes her depressed and shut down. She asked LCSW what she can do to help make her feel better. LCSW utilized supportive and solution focused therapy as primary intervention. LCSW spoke with pt about positive and negative reinforcement and how that could be applied to her children. Myleen states that she feels reinforcement might be too late as 2/3 three children are adults, but still live with her.  LCSW explained that reinforcement can be used at all ages and pt still does have leverage with her children as they live with her. Pt was agreeable to try this and report back. Virdia also explained that whenever people talk seriously to her, she feels like they are yelling and pt get overwhelmed. LCSW worked with pt on a deep breathing exercise to help pt calm down in the moment. Nykeria reported relief from exercise.    Assessment/Plan: Pt endorses symptoms for depression and anxiety for tension, worry, insomnia, tearfulness, hopelessness, and worthlessness. Eliyah continues to meet criteria for Major depression and GAD. She has been taking medications as prescribed. Plan moving forward is to attempt to take dog for walks 1 to 2 times per week to provide pt with an activity she can do outside of the home to increase her mood. She will also attempt to utilize deep breathing exercise when she feels tension or worry. Gabbriella will then report back in 2 weeks during her next f/u appointment.  Participation Level: Active  Behavioral Response: Casual and Fairly GroomedAlertDepressed  Type of Therapy: Individual  Therapy  Treatment Goals addressed: Diagnosis: Major Depression   Interventions: CBT and Supportive  Summary: Darielys Giglia is a 44 y.o. female who presents with major depression.    Suicidal/Homicidal: Yeswithout intent/plan     Plan: Return again in 2 weeks.     Dory Horn, LCSW 11/12/2020

## 2020-11-23 ENCOUNTER — Ambulatory Visit: Payer: Self-pay | Admitting: Nurse Practitioner

## 2020-11-28 ENCOUNTER — Other Ambulatory Visit: Payer: Self-pay

## 2020-11-28 ENCOUNTER — Ambulatory Visit (INDEPENDENT_AMBULATORY_CARE_PROVIDER_SITE_OTHER): Payer: No Payment, Other | Admitting: Licensed Clinical Social Worker

## 2020-11-28 DIAGNOSIS — F331 Major depressive disorder, recurrent, moderate: Secondary | ICD-10-CM | POA: Diagnosis not present
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
## 2020-12-03 ENCOUNTER — Encounter (HOSPITAL_COMMUNITY): Payer: Medicaid Other | Admitting: Psychiatry

## 2020-12-27 ENCOUNTER — Other Ambulatory Visit (HOSPITAL_COMMUNITY): Payer: Self-pay | Admitting: Psychiatry

## 2020-12-27 DIAGNOSIS — F411 Generalized anxiety disorder: Secondary | ICD-10-CM

## 2020-12-27 DIAGNOSIS — F331 Major depressive disorder, recurrent, moderate: Secondary | ICD-10-CM

## 2021-01-01 ENCOUNTER — Encounter (HOSPITAL_COMMUNITY): Payer: BLUE CROSS/BLUE SHIELD | Admitting: Psychiatry

## 2021-01-10 ENCOUNTER — Encounter (HOSPITAL_COMMUNITY): Payer: Self-pay

## 2021-01-11 ENCOUNTER — Ambulatory Visit (HOSPITAL_COMMUNITY): Payer: No Payment, Other | Admitting: Licensed Clinical Social Worker

## 2021-01-11 ENCOUNTER — Encounter (HOSPITAL_COMMUNITY): Payer: Self-pay

## 2021-01-11 ENCOUNTER — Other Ambulatory Visit: Payer: Self-pay

## 2021-02-06 ENCOUNTER — Telehealth (HOSPITAL_COMMUNITY): Payer: Self-pay | Admitting: *Deleted

## 2021-02-06 NOTE — Telephone Encounter (Signed)
VM from patient stating she had stopped her medicine now she wants to restart it and wants to speak with the dr. Hulen Skains her back to understand what she wanted and which medicine she was talking about. She wants to return to taking Strattera. She states she stopped it because she wasn't feeling right but now she has gained weight she wants to start it back. She has a supply to restart on it. Writer told her she can restart it and I would let the provider know of the decision and for her to call provider/writer if she has any difficulties or further concerns. She expressed her understanding.

## 2021-02-13 ENCOUNTER — Other Ambulatory Visit: Payer: Self-pay

## 2021-02-13 ENCOUNTER — Ambulatory Visit (INDEPENDENT_AMBULATORY_CARE_PROVIDER_SITE_OTHER): Payer: No Payment, Other | Admitting: Psychiatry

## 2021-02-13 ENCOUNTER — Encounter (HOSPITAL_COMMUNITY): Payer: Self-pay | Admitting: Psychiatry

## 2021-02-13 DIAGNOSIS — F331 Major depressive disorder, recurrent, moderate: Secondary | ICD-10-CM

## 2021-02-13 DIAGNOSIS — F411 Generalized anxiety disorder: Secondary | ICD-10-CM | POA: Diagnosis not present

## 2021-02-13 DIAGNOSIS — F900 Attention-deficit hyperactivity disorder, predominantly inattentive type: Secondary | ICD-10-CM

## 2021-02-13 MED ORDER — FLUOXETINE HCL 40 MG PO CAPS: 40.0000 mg | ORAL_CAPSULE | Freq: Every day | ORAL | 2 refills | Status: DC

## 2021-02-13 MED ORDER — BUPROPION HCL ER (XL) 150 MG PO TB24: 150.0000 mg | ORAL_TABLET | ORAL | 2 refills | Status: DC

## 2021-02-13 MED ORDER — TRAZODONE HCL 100 MG PO TABS: 100.0000 mg | ORAL_TABLET | Freq: Every day | ORAL | 2 refills | Status: DC
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
## 2021-02-18 ENCOUNTER — Other Ambulatory Visit: Payer: Self-pay

## 2021-02-18 ENCOUNTER — Other Ambulatory Visit (HOSPITAL_COMMUNITY): Payer: Self-pay | Admitting: Psychiatry

## 2021-02-18 ENCOUNTER — Telehealth (HOSPITAL_COMMUNITY): Payer: Self-pay | Admitting: *Deleted

## 2021-02-18 ENCOUNTER — Ambulatory Visit (INDEPENDENT_AMBULATORY_CARE_PROVIDER_SITE_OTHER): Payer: No Payment, Other | Admitting: Licensed Clinical Social Worker

## 2021-02-18 DIAGNOSIS — F900 Attention-deficit hyperactivity disorder, predominantly inattentive type: Secondary | ICD-10-CM

## 2021-02-18 DIAGNOSIS — F3181 Bipolar II disorder: Secondary | ICD-10-CM | POA: Diagnosis not present

## 2021-02-18 DIAGNOSIS — F331 Major depressive disorder, recurrent, moderate: Secondary | ICD-10-CM

## 2021-02-18 MED ORDER — BUPROPION HCL ER (XL) 150 MG PO TB24: 150.0000 mg | ORAL_TABLET | ORAL | 2 refills | Status: DC

## 2021-02-18 MED ORDER — TRAZODONE HCL 100 MG PO TABS: 100.0000 mg | ORAL_TABLET | Freq: Every day | ORAL | 2 refills | Status: DC

## 2021-02-18 NOTE — Telephone Encounter (Signed)
Provider called patient and was informed that she discontinued her trazodone for 6 months ago and notes that she has not been sleeping.  She also notes that she has been more irritable.  Provider informed patient that Wellbutrin can cause insomnia and irritability.  At this time she notes that she wants to continue Wellbutrin.  She is agreeable to restarting trazodone 50 mg-100 mg as needed for sleep.  No other concerns at this time.

## 2021-02-18 NOTE — Progress Notes (Signed)
   THERAPIST PROGRESS NOTE  Session Time: 29  Therapist Response:    Subjective/Objective:  Pt was alert and oriented x 5. She was dressed casually and engaged well in therapy session. Pt presented with flat, depressed, and anxious mood/affect. Jonasia was cooperative and maintained good eye contact.   Primary stressor for pt is legal and relationship. Pt continues to struggle with staying with her spouse and children or leaving them for her boyfriend Ovid Curd. Pt reports that she is afraid of leaving her spouse due to her children. She states that she has been steeling goods from big cooperation such as Paediatric nurse, [employer], etc. Yvana says  that this gives her a euphoria feeling.    Assessment: Pt endorses symptoms for reckless behavior, hypersexual activity, euphoria, mood swings, tension, worry, and hopelessness. With new information gathered into days session pt now meets criteria for bipolar 2 disorder. Felesia was explained the signs and symptoms to look out for.   Plan: LCSW offered pt solution focused and supportive therapy. Pt was provided resource for [place] for 1v1 peer support. Pt was agreeable to do an intake before next appointment. Participation Level: Active  Behavioral Response: CasualAlertAnxious and Depressed  Type of Therapy: Individual Therapy  Treatment Goals addressed: Diagnosis: Depression   Interventions: CBT and Supportive  Summary: Lulabelle Desta is a 45 y.o. female who presents with Major depression.   Suicidal/Homicidal: NAwithout intent/plan    Plan:  Return again in 6 weeks.      Dory Horn, LCSW 02/18/2021

## 2021-02-18 NOTE — Telephone Encounter (Signed)
VM left for writer that her new medicine made her agitated and cants sleep. States she has not taken Trazodone for 6 months and wondering if she should restart it. Will refer her concern to Eulis Canner DMP for her consideration, she has seen her in the last week.

## 2021-03-05 ENCOUNTER — Telehealth (HOSPITAL_COMMUNITY): Payer: Self-pay | Admitting: *Deleted

## 2021-03-05 NOTE — Telephone Encounter (Signed)
Patient called about confusion over her Prozac dosage.  Placed call to pharmacy to verify medication dosage.  Awaiting call back from pharmacy for verification.

## 2021-04-02 ENCOUNTER — Other Ambulatory Visit: Payer: Self-pay

## 2021-04-02 ENCOUNTER — Ambulatory Visit (INDEPENDENT_AMBULATORY_CARE_PROVIDER_SITE_OTHER): Payer: No Payment, Other | Admitting: Licensed Clinical Social Worker

## 2021-04-02 DIAGNOSIS — F331 Major depressive disorder, recurrent, moderate: Secondary | ICD-10-CM

## 2021-04-02 DIAGNOSIS — F3181 Bipolar II disorder: Secondary | ICD-10-CM | POA: Insufficient documentation
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
## 2021-04-16 ENCOUNTER — Telehealth (HOSPITAL_COMMUNITY): Payer: Self-pay | Admitting: *Deleted

## 2021-04-16 ENCOUNTER — Other Ambulatory Visit (HOSPITAL_COMMUNITY): Payer: Self-pay | Admitting: Psychiatry

## 2021-04-16 DIAGNOSIS — F331 Major depressive disorder, recurrent, moderate: Secondary | ICD-10-CM

## 2021-04-16 DIAGNOSIS — F411 Generalized anxiety disorder: Secondary | ICD-10-CM

## 2021-04-16 MED ORDER — FLUOXETINE HCL 40 MG PO CAPS: 80.0000 mg | ORAL_CAPSULE | Freq: Every day | ORAL | 2 refills | Status: DC

## 2021-04-16 NOTE — Telephone Encounter (Signed)
Patient called and asked to have Prozac 40mg   increased to 80mg .  According to her records it was decreased to 40mg  from 60 because it caused her hand tremors. She wants a call back.  Please review and advise.

## 2021-04-16 NOTE — Telephone Encounter (Signed)
Left message for patient advising of change and refill.

## 2021-04-16 NOTE — Telephone Encounter (Signed)
Opened in error

## 2021-04-16 NOTE — Telephone Encounter (Signed)
Patient notes that she has been taking Prozac 60 for the last months.  She notes that she had an extra bottle of 20 mg and took it with her 40 mg.  She notes that she continues to feel anxious and depressed and would like her medications readjusted.  Provider informed patient that the medication was reduced because she complained of tremors.  She notes that she feels that tremors presented because she was on a variety of different medications.  She notes that she no longer takes trazodone or Wellbutrin.  Provider was agreeable to increase Prozac 60 mg to 80 mg to help manage anxiety and depression.  Patient will follow back up on 05/09/2021.  No other concerns noted at this time.

## 2021-04-18 ENCOUNTER — Telehealth (HOSPITAL_COMMUNITY): Payer: Self-pay | Admitting: *Deleted

## 2021-04-18 ENCOUNTER — Other Ambulatory Visit (HOSPITAL_COMMUNITY): Payer: Self-pay | Admitting: Psychiatry

## 2021-04-18 DIAGNOSIS — F411 Generalized anxiety disorder: Secondary | ICD-10-CM

## 2021-04-18 DIAGNOSIS — F331 Major depressive disorder, recurrent, moderate: Secondary | ICD-10-CM

## 2021-04-18 MED ORDER — FLUOXETINE HCL 40 MG PO CAPS: 80.0000 mg | ORAL_CAPSULE | Freq: Every day | ORAL | 2 refills | Status: DC

## 2021-04-18 NOTE — Telephone Encounter (Signed)
Medication refilled and sent to [facility].

## 2021-04-18 NOTE — Telephone Encounter (Signed)
Call from [place] pharmacist. Patient there now to pick up Rx for Prozac, she takes it BID and the amount was for 30 and she needs 60. Would you please call in a new rx for another 30, or 60.

## 2021-04-29 ENCOUNTER — Ambulatory Visit (HOSPITAL_COMMUNITY): Payer: No Payment, Other | Admitting: Licensed Clinical Social Worker

## 2021-05-09 ENCOUNTER — Encounter (HOSPITAL_COMMUNITY): Payer: Self-pay | Admitting: Psychiatry

## 2021-05-09 ENCOUNTER — Other Ambulatory Visit: Payer: Self-pay

## 2021-05-09 ENCOUNTER — Ambulatory Visit (INDEPENDENT_AMBULATORY_CARE_PROVIDER_SITE_OTHER): Payer: No Payment, Other | Admitting: Psychiatry

## 2021-05-09 VITALS — BP 124/75 | HR 88 | Ht 62.0 in | Wt 147.0 lb

## 2021-05-09 DIAGNOSIS — F17200 Nicotine dependence, unspecified, uncomplicated: Secondary | ICD-10-CM

## 2021-05-09 DIAGNOSIS — H93233 Hyperacusis, bilateral: Secondary | ICD-10-CM | POA: Diagnosis not present

## 2021-05-09 DIAGNOSIS — F411 Generalized anxiety disorder: Secondary | ICD-10-CM

## 2021-05-09 DIAGNOSIS — F331 Major depressive disorder, recurrent, moderate: Secondary | ICD-10-CM | POA: Diagnosis not present

## 2021-05-09 MED ORDER — ARIPIPRAZOLE 5 MG PO TABS: 5.0000 mg | ORAL_TABLET | Freq: Every day | ORAL | 2 refills | Status: DC

## 2021-05-09 MED ORDER — FLUOXETINE HCL 40 MG PO CAPS: 40.0000 mg | ORAL_CAPSULE | Freq: Every day | ORAL | 2 refills | Status: DC

## 2021-05-09 MED ORDER — TRAZODONE HCL 50 MG PO TABS: 50.0000 mg | ORAL_TABLET | Freq: Every day | ORAL | 2 refills | Status: DC

## 2021-05-09 MED ORDER — FLUOXETINE HCL 40 MG PO CAPS: 80.0000 mg | ORAL_CAPSULE | Freq: Every day | ORAL | 2 refills | Status: DC
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
## 2021-06-04 ENCOUNTER — Other Ambulatory Visit: Payer: Self-pay

## 2021-06-04 ENCOUNTER — Ambulatory Visit (INDEPENDENT_AMBULATORY_CARE_PROVIDER_SITE_OTHER): Payer: No Payment, Other | Admitting: Licensed Clinical Social Worker

## 2021-06-04 DIAGNOSIS — F331 Major depressive disorder, recurrent, moderate: Secondary | ICD-10-CM
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
## 2021-07-17 ENCOUNTER — Ambulatory Visit (INDEPENDENT_AMBULATORY_CARE_PROVIDER_SITE_OTHER): Payer: No Payment, Other | Admitting: Licensed Clinical Social Worker

## 2021-07-17 ENCOUNTER — Other Ambulatory Visit: Payer: Self-pay

## 2021-07-17 DIAGNOSIS — F411 Generalized anxiety disorder: Secondary | ICD-10-CM

## 2021-07-17 DIAGNOSIS — F331 Major depressive disorder, recurrent, moderate: Secondary | ICD-10-CM | POA: Diagnosis not present
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
## 2021-08-06 ENCOUNTER — Other Ambulatory Visit: Payer: Self-pay

## 2021-08-06 ENCOUNTER — Ambulatory Visit (INDEPENDENT_AMBULATORY_CARE_PROVIDER_SITE_OTHER): Payer: BLUE CROSS/BLUE SHIELD | Admitting: Psychiatry

## 2021-08-06 ENCOUNTER — Encounter (HOSPITAL_COMMUNITY): Payer: No Payment, Other | Admitting: Psychiatry

## 2021-08-06 VITALS — BP 105/71 | HR 67 | Ht 62.0 in | Wt 152.0 lb

## 2021-08-06 DIAGNOSIS — F411 Generalized anxiety disorder: Secondary | ICD-10-CM

## 2021-08-06 DIAGNOSIS — F900 Attention-deficit hyperactivity disorder, predominantly inattentive type: Secondary | ICD-10-CM

## 2021-08-06 DIAGNOSIS — F331 Major depressive disorder, recurrent, moderate: Secondary | ICD-10-CM | POA: Diagnosis not present

## 2021-08-06 MED ORDER — ATOMOXETINE HCL 40 MG PO CAPS: 40.0000 mg | ORAL_CAPSULE | Freq: Every day | ORAL | 2 refills | Status: DC

## 2021-08-06 MED ORDER — TRAZODONE HCL 50 MG PO TABS: 50.0000 mg | ORAL_TABLET | Freq: Every day | ORAL | 2 refills | Status: DC

## 2021-08-06 MED ORDER — FLUOXETINE HCL 40 MG PO CAPS: 40.0000 mg | ORAL_CAPSULE | Freq: Every day | ORAL | 2 refills | Status: DC
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
## 2021-08-22 ENCOUNTER — Encounter (HOSPITAL_COMMUNITY): Payer: No Payment, Other | Admitting: Physician Assistant

## 2021-09-05 ENCOUNTER — Encounter (HOSPITAL_COMMUNITY): Payer: No Payment, Other | Admitting: Physician Assistant

## 2021-11-07 ENCOUNTER — Other Ambulatory Visit: Payer: Self-pay

## 2021-11-07 ENCOUNTER — Ambulatory Visit (INDEPENDENT_AMBULATORY_CARE_PROVIDER_SITE_OTHER): Payer: BLUE CROSS/BLUE SHIELD | Admitting: Psychiatry

## 2021-11-07 ENCOUNTER — Encounter (HOSPITAL_COMMUNITY): Payer: Self-pay | Admitting: Psychiatry

## 2021-11-07 DIAGNOSIS — F900 Attention-deficit hyperactivity disorder, predominantly inattentive type: Secondary | ICD-10-CM | POA: Diagnosis not present

## 2021-11-07 DIAGNOSIS — F411 Generalized anxiety disorder: Secondary | ICD-10-CM

## 2021-11-07 DIAGNOSIS — R413 Other amnesia: Secondary | ICD-10-CM | POA: Diagnosis not present

## 2021-11-07 DIAGNOSIS — F331 Major depressive disorder, recurrent, moderate: Secondary | ICD-10-CM

## 2021-11-07 MED ORDER — TRAZODONE HCL 50 MG PO TABS
50.0000 mg | ORAL_TABLET | Freq: Every day | ORAL | 3 refills | Status: DC
Start: 2021-11-07 — End: 2022-02-05

## 2021-11-07 MED ORDER — FLUOXETINE HCL 40 MG PO CAPS: 40.0000 mg | ORAL_CAPSULE | Freq: Every day | ORAL | 3 refills | Status: DC

## 2021-11-07 MED ORDER — ATOMOXETINE HCL 40 MG PO CAPS: 40.0000 mg | ORAL_CAPSULE | Freq: Every day | ORAL | 3 refills | Status: DC
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
## 2022-02-05 ENCOUNTER — Encounter (HOSPITAL_COMMUNITY): Payer: Self-pay | Admitting: Psychiatry

## 2022-02-05 ENCOUNTER — Ambulatory Visit (INDEPENDENT_AMBULATORY_CARE_PROVIDER_SITE_OTHER): Payer: BLUE CROSS/BLUE SHIELD | Admitting: Psychiatry

## 2022-02-05 DIAGNOSIS — F900 Attention-deficit hyperactivity disorder, predominantly inattentive type: Secondary | ICD-10-CM | POA: Diagnosis not present

## 2022-02-05 DIAGNOSIS — F32A Depression, unspecified: Secondary | ICD-10-CM | POA: Diagnosis not present

## 2022-02-05 DIAGNOSIS — F411 Generalized anxiety disorder: Secondary | ICD-10-CM | POA: Diagnosis not present

## 2022-02-05 MED ORDER — TRAZODONE HCL 100 MG PO TABS: 100.0000 mg | ORAL_TABLET | Freq: Every day | ORAL | 3 refills | Status: DC

## 2022-02-05 MED ORDER — ATOMOXETINE HCL 40 MG PO CAPS: 40.0000 mg | ORAL_CAPSULE | Freq: Every day | ORAL | 3 refills | Status: DC

## 2022-02-05 MED ORDER — FLUOXETINE HCL 40 MG PO CAPS: 40.0000 mg | ORAL_CAPSULE | Freq: Every day | ORAL | 3 refills | Status: DC

## 2022-02-05 NOTE — Progress Notes (Signed)
BH MD/PA/NP OP Progress Note Virtual Visit via Telephone Note  I connected with Angela Romero on 02/05/22 at  1:00 PM EST by telephone and verified that I am speaking with the correct person using two identifiers.  Location: Patient: home Provider: Clinic   I discussed the limitations, risks, security and privacy concerns of performing an evaluation and management service by telephone and the availability of in person appointments. I also discussed with the patient that there may be a patient responsible charge related to this service. The patient expressed understanding and agreed to proceed.   I provided 30 minutes of non-face-to-face time during this encounter.  02/05/2022 1:23 PM Angela Romero  MRN:  [id number]  Chief Complaint: "I have been having a little problems sleeping"   HPI: 46 year old female seen today for follow up psychiatric evaluation.She has a psychiatric history or ADHD, depression, SI, Cannabis use (in remission), and adjustment disorder.  She is currently being manage on Prozac 40 mg daily, Strattera 40 mg daily, and Trazodone 50 mg nightly.  Patient notes that her medications are somewhat effective in managing her psychiatric conditions  Today she was to logon virtually so assessment was done on phone.  During exam she was pleasant, cooperative, and engaged in conversation.  She informed Probation officer that since her last visit she has been having some problems sleeping.  She notes that she does get approximately 6 hours nightly but notes that it is disturbed.  Since her last visit she notes that her anxiety and depression has somewhat improved.  Provider conducted a GAD-7 and patient scored a 6, at her last visit she scored a 5.  Provider also conducted PHQ-9 and patient scored a 12, at her last visit she scored an 11.  She endorsed adequate appetite.  Today she denies SI/HI/VAH, mania, or paranoia.   Today patient is agreeable to increasing trazodone 50 mg to 100 mg  nightly as needed to help manage sleep.  She will continue all other medications as prescribed.  No other concerns at this time.         Visit Diagnosis:    ICD-10-CM   1. Mild depression  F32.A traZODone (DESYREL) 100 MG tablet    FLUoxetine (PROZAC) 40 MG capsule    2. Generalized anxiety disorder  F41.1 FLUoxetine (PROZAC) 40 MG capsule    3. Attention deficit hyperactivity disorder (ADHD), predominantly inattentive type  F90.0 atomoxetine (STRATTERA) 40 MG capsule      Past Psychiatric History: ADHD, depression, SI, Cannabis use (in remission), and adjustment disorder   Past Medical History:  Past Medical History:  Diagnosis Date   Anemia    Depression    Medical history non-contributory    Memory difficulty    Problems with hearing     Past Surgical History:  Procedure Laterality Date   EYE SURGERY     INNER EAR SURGERY  1996    x 4 other 3 as a child   REFRACTIVE SURGERY  07/2014    Family Psychiatric History: Son Depression and daughter depression and SI    Family History:  Family History  Problem Relation Age of Onset   Diabetes Mellitus II Mother    Prostate cancer Father    Depression Child    Colon cancer Neg Hx     Social History:  Social History   Socioeconomic History   Marital status: Married    Spouse name: Not on file   Number of children: 3   Years of  education: Not on file   Highest education level: Not on file  Occupational History   Occupation: vendor    Comment: hot dog  Tobacco Use   Smoking status: Former    Types: Cigarettes    Start date: 09/11/2020   Smokeless tobacco: Never  Vaping Use   Vaping Use: Never used  Substance and Sexual Activity   Alcohol use: Yes    Alcohol/week: 1.0 standard drink    Types: 1 Shots of liquor per week    Comment: 2-3 ime per month   Drug use: No   Sexual activity: Yes    Birth control/protection: None  Other Topics Concern   Not on file  Social History Narrative   ** Merged History  Encounter **       Social Determinants of Health   Financial Resource Strain: Not on file  Food Insecurity: Not on file  Transportation Needs: Not on file  Physical Activity: Not on file  Stress: Not on file  Social Connections: Not on file    Allergies: No Known Allergies  Metabolic Disorder Labs: Lab Results  Component Value Date   HGBA1C 5.7 (H) 11/01/2017   MPG 116.89 11/01/2017   No results found for: PROLACTIN Lab Results  Component Value Date   CHOL 170 11/01/2017   TRIG 128 11/01/2017   HDL 56 11/01/2017   CHOLHDL 3.0 11/01/2017   VLDL 26 11/01/2017   LDLCALC 88 11/01/2017   Lab Results  Component Value Date   TSH 3.43 10/20/2018   TSH 3.808 11/01/2017    Therapeutic Level Labs: No results found for: LITHIUM No results found for: VALPROATE No components found for:  CBMZ  Current Medications: Current Outpatient Medications  Medication Sig Dispense Refill   albuterol (VENTOLIN HFA) 108 (90 Base) MCG/ACT inhaler Inhale 1 puff into the lungs every 6 (six) hours as needed for wheezing or shortness of breath.     aspirin EC 81 MG tablet Take 81 mg by mouth daily.     atomoxetine (STRATTERA) 40 MG capsule Take 1 capsule (40 mg total) by mouth daily. 30 capsule 3   FLUoxetine (PROZAC) 40 MG capsule Take 1 capsule (40 mg total) by mouth daily. 30 capsule 3   Multiple Vitamin (MULTIVITAMIN) tablet Take 1 tablet by mouth daily.     Red Yeast Rice Extract (RED YEAST RICE PO) Take 1 capsule by mouth daily.     traZODone (DESYREL) 100 MG tablet Take 1 tablet (100 mg total) by mouth at bedtime. 30 tablet 3   No current facility-administered medications for this visit.      Strength & Muscle Tone:  Unable to assess due to telephone visit Gait & Station:  Unable to assess due to telephone visit Patient leans: N/A  Psychiatric Specialty Exam: Review of Systems  unknown if currently breastfeeding.There is no height or weight on file to calculate BMI.  General  Appearance:  Unable to assess due to telephone visit  Eye Contact:   Unable to assess due to telephone visit  Speech:  Clear and Coherent and Slow  Volume:  Normal  Mood:  Euthymic  Affect:   Unable to assess due to telephone visit  Thought Process:  Coherent, Goal Directed and Linear  Orientation:  Full (Time, Place, and Person)  Thought Content: WDL and Logical   Suicidal Thoughts:  No  Homicidal Thoughts:  No  Memory:  Immediate;   Good Recent;   Good Remote;   Good  Judgement:  Good  Insight:  Good  Psychomotor Activity:  Normal  Concentration:  Concentration: Good and Attention Span: Good  Recall:  Good  Fund of Knowledge: Good  Language: Good  Akathisia:   Unable to assess due to telephone visit  Handed:  Right  AIMS (if indicated): Not done Unable to assess due to telephone visit  Assets:  Communication Skills Desire for Improvement Financial Resources/Insurance Housing Social Support  ADL's:  Intact  Cognition: WNL  Sleep:  Fair   Screenings: AIMS    Flowsheet Row Admission (Discharged) from 10/31/2017 in [place] [facility]  AIMS Total Score 0      AUDIT    Flowsheet Row Admission (Discharged) from 10/31/2017 in [place] [facility]  Alcohol Use Disorder Identification Test Final Score (AUDIT) 0      GAD-7    Flowsheet Row Office Visit from 02/05/2022 in [facility] Clinical Support from 11/07/2021 in [place] from 08/06/2021 in [facility] Counselor from 07/17/2021 in [place] from 05/09/2021 in [facility]  Total GAD-7 Score 6 5 3 5 4       PHQ2-9    [place] Office Visit from 02/05/2022 in [place] from 11/07/2021 in [place] from 08/06/2021 in [facility] Counselor from 07/17/2021 in [place] from 05/09/2021 in [place]  PHQ-2 Total Score 2 2 1 4 2   PHQ-9 Total Score 12 11 7 17 12       [place] from 02/13/2021 in [facility] Counselor from 09/18/2020 in [place] Error: Q7 should not be populated when Q6 is No No Risk        Assessment and Plan: Patient notes that her sleep has been poor since her last visit.  She informed Probation officer that she is able to cope with her mild anxiety and depression. Today patient is agreeable to increasing trazodone 50 mg to 100 mg nightly as needed to help manage sleep.  She will continue all other medications as prescribed.   1. Generalized anxiety disorder  Continue- FLUoxetine (PROZAC) 40 MG capsule; Take 1 capsule (40 mg total) by mouth daily.  Dispense: 30 capsule; Refill: 3  2. Attention deficit hyperactivity disorder (ADHD), predominantly inattentive type  Continue- atomoxetine (STRATTERA) 40 MG capsule; Take 1 capsule (40 mg total) by mouth daily.  Dispense: 30 capsule; Refill: 3  3. Mild depression  Increased- traZODone (DESYREL) 100 MG tablet; Take 1 tablet (100 mg total) by mouth at bedtime.  Dispense: 30 tablet; Refill: 3 Continue- FLUoxetine (PROZAC) 40 MG capsule; Take 1 capsule (40 mg total) by mouth daily.  Dispense: 30 capsule; Refill: 3     Follow up in 3 months Follow up with therapy  Salley Slaughter, NP 02/05/2022, 1:23 PM

## 2022-04-21 ENCOUNTER — Telehealth (HOSPITAL_COMMUNITY): Payer: BLUE CROSS/BLUE SHIELD | Admitting: Psychiatry

## 2022-05-06 ENCOUNTER — Ambulatory Visit (INDEPENDENT_AMBULATORY_CARE_PROVIDER_SITE_OTHER): Payer: BLUE CROSS/BLUE SHIELD | Admitting: Psychiatry

## 2022-05-06 DIAGNOSIS — F411 Generalized anxiety disorder: Secondary | ICD-10-CM

## 2022-05-06 DIAGNOSIS — F900 Attention-deficit hyperactivity disorder, predominantly inattentive type: Secondary | ICD-10-CM

## 2022-05-06 DIAGNOSIS — F32A Depression, unspecified: Secondary | ICD-10-CM | POA: Diagnosis not present

## 2022-05-06 MED ORDER — ATOMOXETINE HCL 40 MG PO CAPS: 40.0000 mg | ORAL_CAPSULE | Freq: Every day | ORAL | 0 refills | Status: DC

## 2022-05-06 MED ORDER — HYDROXYZINE HCL 25 MG PO TABS: 25.0000 mg | ORAL_TABLET | Freq: Three times a day (TID) | ORAL | 0 refills | Status: DC | PRN

## 2022-05-06 MED ORDER — TRAZODONE HCL 100 MG PO TABS: 100.0000 mg | ORAL_TABLET | Freq: Every day | ORAL | 0 refills | Status: DC

## 2022-05-06 MED ORDER — FLUOXETINE HCL 40 MG PO CAPS: 40.0000 mg | ORAL_CAPSULE | Freq: Every day | ORAL | 0 refills | Status: DC

## 2022-05-06 NOTE — Progress Notes (Signed)
BH MD/PA/NP OP Progress Note ? ?05/06/2022 5:00 PM ?Angela Romero  ?MRN:  [id number] ? ?Virtual Visit via Telephone Note ? ?I connected with Angela Romero on 05/06/22 at  3:00 PM EDT by telephone and verified that I am speaking with the correct person using two identifiers. ? ?Location: ?Patient: home ?Provider: offsite ?  ?I discussed the limitations, risks, security and privacy concerns of performing an evaluation and management service by telephone and the availability of in person appointments. I also discussed with the patient that there may be a patient responsible charge related to this service. The patient expressed understanding and agreed to proceed. ? ?  ?I discussed the assessment and treatment plan with the patient. The patient was provided an opportunity to ask questions and all were answered. The patient agreed with the plan and demonstrated an understanding of the instructions. ?  ?The patient was advised to call back or seek an in-person evaluation if the symptoms worsen or if the condition fails to improve as anticipated. ? ?I provided 10 minutes of non-face-to-face time during this encounter. ? ? ?Franne Grip, NP  ? ?Chief Complaint: Medication management ? ?HPI: Angela Romero is a 46 year old female presenting to [facility] behavioral health outpatient for follow-up psychiatric evaluation.  Patient is being seen by this provider due to her attending psychiatric provider being unavailable to complete today's appointment.  Patient has a psychiatric history of bipolar 2 disorder, depression, ADHD, and generalized anxiety disorder.  Patient symptoms are managed with trazodone 100 mg daily at bedtime, Prozac 40 mg daily and Strattera 40 mg daily.  Patient reports medication compliance and denies adverse medication reactions.  Patient reports shoplifting recently and getting caught, causing her to have a panic attack.  She reports increased anxiety since getting caught shoplifting.   Patient is agreeable to initiating hydroxyzine 25 mg 3 times daily as needed for anxiety.  Medication benefits versus risks discussed.  Patient also reported recently smoking cigarettes.  Smoking cessation discussed, as well as possible negative effects of nicotine on medication availability.  Patient acknowledged understanding and agreed to stop smoking. ? ? ? ?Visit Diagnosis:  ?  ICD-10-CM   ?1. Attention deficit hyperactivity disorder (ADHD), predominantly inattentive type  F90.0 atomoxetine (STRATTERA) 40 MG capsule  ?  ?2. Generalized anxiety disorder  F41.1 FLUoxetine (PROZAC) 40 MG capsule  ?  ?3. Mild depression  F32.A FLUoxetine (PROZAC) 40 MG capsule  ?  traZODone (DESYREL) 100 MG tablet  ?  ? ? ?Past Psychiatric History: Bipolar disorder, depression, ADHD, GAD ? ?Past Medical History:  ?Past Medical History:  ?Diagnosis Date  ? Anemia   ? Depression   ? Medical history non-contributory   ? Memory difficulty   ? Problems with hearing   ?  ?Past Surgical History:  ?Procedure Laterality Date  ? EYE SURGERY    ? [place]  ?  x 4 other 3 as a child  ? REFRACTIVE SURGERY  07/2014  ? ? ?Family Psychiatric History: See below ? ?Family History:  ?Family History  ?Problem Relation Age of Onset  ? Diabetes Mellitus II Mother   ? Prostate cancer Father   ? Depression Child   ? Colon cancer Neg Hx   ? ? ?Social History:  ?Social History  ? ?Socioeconomic History  ? Marital status: Married  ?  Spouse name: Not on file  ? Number of children: 3  ? Years of education: Not on file  ? Highest education level: Not  on file  ?Occupational History  ? 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 Smoking status: Former  ?  Types: Cigarettes  ?  Start date: 09/11/2020  ? Smokeless tobacco: Never  ?Vaping Use  ? Vaping Use: Never used  ?Substance and Sexual Activity  ? Alcohol use: Yes  ?  Alcohol/week: 1.0 standard drink  ?  Types: 1 Shots of liquor per week  ?  Comment: 2-3 ime per month  ? Drug use: No   ? Sexual activity: Yes  ?  Birth control/protection: None  ?Other Topics Concern  ? Not on file  ?Social History Narrative  ? ** Merged History Encounter **  ?    ? ?Social Determinants of Health  ? ?Financial Resource Strain: Not on file  ?Food Insecurity: Not on file  ?Transportation Needs: Not on file  ?Physical Activity: Not on file  ?Stress: Not on file  ?Social Connections: Not on file  ? ? ?Allergies: No Known Allergies ? ?Metabolic Disorder Labs: ?Lab Results  ?Component Value Date  ? HGBA1C 5.7 (H) 11/01/2017  ? MPG 116.89 11/01/2017  ? ?No results found for: PROLACTIN ?Lab Results  ?Component Value Date  ? CHOL 170 11/01/2017  ? TRIG 128 11/01/2017  ? HDL 56 11/01/2017  ? CHOLHDL 3.0 11/01/2017  ? VLDL 26 11/01/2017  ? [place] 88 11/01/2017  ? ?Lab Results  ?Component Value Date  ? TSH 3.43 10/20/2018  ? TSH 3.808 11/01/2017  ? ? ?Therapeutic Level Labs: ?No results found for: LITHIUM ?No results found for: VALPROATE ?No components found for:  CBMZ ? ?Current Medications: ?Current Outpatient Medications  ?Medication Sig Dispense Refill  ? hydrOXYzine (ATARAX) 25 MG tablet Take 1 tablet (25 mg total) by mouth 3 (three) times daily as needed. 90 tablet 0  ? albuterol (VENTOLIN HFA) 108 (90 Base) MCG/ACT inhaler Inhale 1 puff into the lungs every 6 (six) hours as needed for wheezing or shortness of breath.    ? aspirin EC 81 MG tablet Take 81 mg by mouth daily.    ? atomoxetine (STRATTERA) 40 MG capsule Take 1 capsule (40 mg total) by mouth daily. 30 capsule 0  ? FLUoxetine (PROZAC) 40 MG capsule Take 1 capsule (40 mg total) by mouth daily. 30 capsule 0  ? Multiple Vitamin (MULTIVITAMIN) tablet Take 1 tablet by mouth daily.    ? Red Yeast Rice Extract (RED YEAST RICE PO) Take 1 capsule by mouth daily.    ? traZODone (DESYREL) 100 MG tablet Take 1 tablet (100 mg total) by mouth at bedtime. 30 tablet 0  ? ?No current facility-administered medications for this visit.  ? ? ? ?Musculoskeletal: ?Strength & Muscle  Tone: N/A virtual visit ?Gait & Station: N/A ?Patient leans: N/A ? ?Psychiatric Specialty Exam: ?Review of Systems  ?Psychiatric/Behavioral:  Negative for hallucinations, self-injury and suicidal ideas. The patient is nervous/anxious.    ?unknown if currently breastfeeding.There is no height or weight on file to calculate BMI.  ?General Appearance: NA  ?Eye Contact:  NA  ?Speech:  Clear and Coherent  ?Volume:  Normal  ?Mood:  Anxious  ?Affect:  NA  ?Thought Process:  Goal Directed  ?Orientation:  Full (Time, Place, and Person)  ?Thought Content: Logical   ?Suicidal Thoughts:  No  ?Homicidal Thoughts:  No  ?Memory: Good  ?Judgement:  Good  ?Insight:  Good  ?Psychomotor Activity:  NA  ?Concentration: Good  ?Recall:  Good  ?Fund of Knowledge: Good  ?  Language: Good  ?Akathisia:  NA  ?Handed:  Right  ?AIMS (if indicated): not done  ?Assets:  Communication Skills  ?ADL's:  Intact  ?Cognition: WNL  ?Sleep: Good  ? ?Screenings: ?AIMS   ? ?Flowsheet Row Admission (Discharged) from 10/31/2017 in [facility]  ?AIMS Total Score 0  ? ?  ? ?AUDIT   ? ?Flowsheet Row Admission (Discharged) from 10/31/2017 in [facility]  ?Alcohol Use Disorder Identification Test Final Score (AUDIT) 0  ? ?  ? ?GAD-7   ? ?[place] Office Visit from 02/05/2022 in [place] from 11/07/2021 in [place] from 08/06/2021 in [facility] Counselor from 07/17/2021 in [place] from 05/09/2021 in [facility]  ?Total GAD-7 Score '6 5 3 5 4  '$ ? ?  ? ?PHQ2-9   ? ?[place] Office Visit from 02/05/2022 in [place] from 11/07/2021 in [place] from 08/06/2021 in [facility]  Counselor from 07/17/2021 in [place] from 05/09/2021 in [facility]  ?PHQ-2 Total Score '2 2 1 4 2  '$ ?PHQ-9 Total Score '12 11 7 '$ 1

## 2022-08-06 ENCOUNTER — Other Ambulatory Visit (HOSPITAL_COMMUNITY): Payer: Self-pay | Admitting: Psychiatry

## 2022-08-06 ENCOUNTER — Telehealth (HOSPITAL_COMMUNITY): Payer: BLUE CROSS/BLUE SHIELD | Admitting: Psychiatry

## 2022-08-06 ENCOUNTER — Encounter (HOSPITAL_COMMUNITY): Payer: Self-pay

## 2022-08-06 DIAGNOSIS — F411 Generalized anxiety disorder: Secondary | ICD-10-CM

## 2022-08-06 DIAGNOSIS — F32A Depression, unspecified: Secondary | ICD-10-CM

## 2022-08-07 ENCOUNTER — Other Ambulatory Visit (HOSPITAL_COMMUNITY): Payer: Self-pay | Admitting: Psychiatry

## 2022-08-07 DIAGNOSIS — F411 Generalized anxiety disorder: Secondary | ICD-10-CM

## 2022-08-07 DIAGNOSIS — F32A Depression, unspecified: Secondary | ICD-10-CM

## 2022-08-08 ENCOUNTER — Encounter (HOSPITAL_COMMUNITY): Payer: Self-pay | Admitting: Psychiatry

## 2022-08-08 ENCOUNTER — Telehealth (INDEPENDENT_AMBULATORY_CARE_PROVIDER_SITE_OTHER): Payer: BLUE CROSS/BLUE SHIELD | Admitting: Psychiatry

## 2022-08-08 DIAGNOSIS — F900 Attention-deficit hyperactivity disorder, predominantly inattentive type: Secondary | ICD-10-CM | POA: Diagnosis not present

## 2022-08-08 DIAGNOSIS — F32A Depression, unspecified: Secondary | ICD-10-CM

## 2022-08-08 DIAGNOSIS — F411 Generalized anxiety disorder: Secondary | ICD-10-CM | POA: Diagnosis not present

## 2022-08-08 MED ORDER — ATOMOXETINE HCL 40 MG PO CAPS: 40.0000 mg | ORAL_CAPSULE | Freq: Every day | ORAL | 3 refills | Status: DC

## 2022-08-08 MED ORDER — HYDROXYZINE HCL 25 MG PO TABS: 25.0000 mg | ORAL_TABLET | Freq: Three times a day (TID) | ORAL | 3 refills | Status: DC | PRN

## 2022-08-08 MED ORDER — FLUOXETINE HCL 40 MG PO CAPS: 40.0000 mg | ORAL_CAPSULE | Freq: Every day | ORAL | 3 refills | Status: DC

## 2022-08-08 MED ORDER — TRAZODONE HCL 100 MG PO TABS: 100.0000 mg | ORAL_TABLET | Freq: Every day | ORAL | 3 refills | Status: DC

## 2022-08-08 NOTE — Progress Notes (Signed)
[place] MD/PA/NP OP Progress Note Virtual Visit via Telephone Note  I connected with Angela Romero on 08/08/22 at  9:00 AM EDT by telephone and verified that I am speaking with the correct person using two identifiers.  Location: Patient: home Provider: Clinic   I discussed the limitations, risks, security and privacy concerns of performing an evaluation and management service by telephone and the availability of in person appointments. I also discussed with the patient that there may be a patient responsible charge related to this service. The patient expressed understanding and agreed to proceed.   I provided 30 minutes of non-face-to-face time during this encounter.  08/08/2022 9:08 AM Angela Romero  MRN:  [id number]  Chief Complaint: "I am okay"   HPI: 46 year old female seen today for follow up psychiatric evaluation.She has a psychiatric history or ADHD, depression, SI, Cannabis use (in remission), and adjustment disorder.  She is currently being manage on Prozac 40 mg daily, hydroxyzine 25 mg three times daily as needed, Strattera 40 mg daily, and Trazodone 50 mg nightly.  Patient notes that her medications are effective in managing her psychiatric conditions  Today she was to logon virtually so assessment was done on phone.  During exam she was pleasant, cooperative, and engaged in conversation.  She informed Probation officer that since her last visit she has been doing okay. She notes that she has mild anxiety concerning an upcoming court case on Aug 23rd for shoplifting. She reports that she is coping with her anxiety and scored a 9 on her GAD 7. Provider also conducted a PHQ 9 and patient scored a 10. She reports fair sleep noting that she sleeps 5-6 hours nightly. Today she endorsed adequate appetite.  Today she denies SI/HI/VAH, mania, or paranoia.   No medication changes made today. Patient agreeable to continue medications as prescribed.  No other concerns at this time.          Visit Diagnosis:    ICD-10-CM   1. Attention deficit hyperactivity disorder (ADHD), predominantly inattentive type  F90.0 atomoxetine (STRATTERA) 40 MG capsule    2. Generalized anxiety disorder  F41.1 FLUoxetine (PROZAC) 40 MG capsule    hydrOXYzine (ATARAX) 25 MG tablet    3. Mild depression  F32.A FLUoxetine (PROZAC) 40 MG capsule    traZODone (DESYREL) 100 MG tablet      Past Psychiatric History: ADHD, depression, SI, Cannabis use (in remission), and adjustment disorder   Past Medical History:  Past Medical History:  Diagnosis Date   Anemia    Depression    Medical history non-contributory    Memory difficulty    Problems with hearing     Past Surgical History:  Procedure Laterality Date   EYE SURGERY     INNER EAR SURGERY  1996    x 4 other 3 as a child   REFRACTIVE SURGERY  07/2014    Family Psychiatric History: Son Depression and daughter depression and SI    Family History:  Family History  Problem Relation Age of Onset   Diabetes Mellitus II Mother    Prostate cancer Father    Depression Child    Colon cancer Neg Hx     Social History:  Social History   Socioeconomic History   Marital status: Married    Spouse name: Not on file   Number of children: 3   Years of education: Not on file   Highest education level: Not on file  Occupational History   Occupation: vendor  Comment: hot dog  Tobacco Use   Smoking status: Former    Types: Cigarettes    Start date: 09/11/2020   Smokeless tobacco: Never  Vaping Use   Vaping Use: Never used  Substance and Sexual Activity   Alcohol use: Yes    Alcohol/week: 1.0 standard drink of alcohol    Types: 1 Shots of liquor per week    Comment: 2-3 ime per month   Drug use: No   Sexual activity: Yes    Birth control/protection: None  Other Topics Concern   Not on file  Social History Narrative   ** Merged History Encounter **       Social Determinants of Health   Financial Resource Strain: Low Risk   (09/18/2020)   Overall Financial Resource Strain (CARDIA)    Difficulty of Paying Living Expenses: Not hard at all  Food Insecurity: No Food Insecurity (09/18/2020)   Hunger Vital Sign    Worried About Running Out of Food in the Last Year: Never true    Ran Out of Food in the Last Year: Never true  Transportation Needs: No Transportation Needs (09/18/2020)   PRAPARE - Hydrologist (Medical): No    Lack of Transportation (Non-Medical): No  Physical Activity: Inactive (09/18/2020)   Exercise Vital Sign    Days of Exercise per Week: 0 days    Minutes of Exercise per Session: 0 min  Stress: Stress Concern Present (09/18/2020)   [place]    Feeling of Stress : To some extent  Social Connections: Socially Isolated (09/18/2020)   Social Connection and Isolation Panel [NHANES]    Frequency of Communication with Friends and Family: Once a week    Frequency of Social Gatherings with Friends and Family: Never    Attends Religious Services: Never    Marine scientist or Organizations: No    Attends Music therapist: Never    Marital Status: Married    Allergies: No Known Allergies  Metabolic Disorder Labs: Lab Results  Component Value Date   HGBA1C 5.7 (H) 11/01/2017   MPG 116.89 11/01/2017   No results found for: "PROLACTIN" Lab Results  Component Value Date   CHOL 170 11/01/2017   TRIG 128 11/01/2017   HDL 56 11/01/2017   CHOLHDL 3.0 11/01/2017   VLDL 26 11/01/2017   [place] 88 11/01/2017   Lab Results  Component Value Date   TSH 3.43 10/20/2018   TSH 3.808 11/01/2017    Therapeutic Level Labs: No results found for: "LITHIUM" No results found for: "VALPROATE" No results found for: "CBMZ"  Current Medications: Current Outpatient Medications  Medication Sig Dispense Refill   albuterol (VENTOLIN HFA) 108 (90 Base) MCG/ACT inhaler Inhale 1 puff into the lungs every  6 (six) hours as needed for wheezing or shortness of breath.     aspirin EC 81 MG tablet Take 81 mg by mouth daily.     atomoxetine (STRATTERA) 40 MG capsule Take 1 capsule (40 mg total) by mouth daily. 30 capsule 3   FLUoxetine (PROZAC) 40 MG capsule Take 1 capsule (40 mg total) by mouth daily. 30 capsule 3   hydrOXYzine (ATARAX) 25 MG tablet Take 1 tablet (25 mg total) by mouth 3 (three) times daily as needed. 90 tablet 3   Multiple Vitamin (MULTIVITAMIN) tablet Take 1 tablet by mouth daily.     Red Yeast Rice Extract (RED YEAST RICE PO) Take 1 capsule  by mouth daily.     traZODone (DESYREL) 100 MG tablet Take 1 tablet (100 mg total) by mouth at bedtime. 30 tablet 3   No current facility-administered medications for this visit.      Strength & Muscle Tone:  Unable to assess due to telephone visit Gait & Station:  Unable to assess due to telephone visit Patient leans: N/A  Psychiatric Specialty Exam: Review of Systems  unknown if currently breastfeeding.There is no height or weight on file to calculate BMI.  General Appearance:  Unable to assess due to telephone visit  Eye Contact:   Unable to assess due to telephone visit  Speech:  Clear and Coherent and Slow  Volume:  Normal  Mood:  Euthymic  Affect:   Unable to assess due to telephone visit  Thought Process:  Coherent, Goal Directed and Linear  Orientation:  Full (Time, Place, and Person)  Thought Content: WDL and Logical   Suicidal Thoughts:  No  Homicidal Thoughts:  No  Memory:  Immediate;   Good Recent;   Good Remote;   Good  Judgement:  Good  Insight:  Good  Psychomotor Activity:   Unable to assess due to telephone visit  Concentration:  Concentration: Good and Attention Span: Good  Recall:  Good  Fund of Knowledge: Good  Language: Good  Akathisia:   Unable to assess due to telephone visit  Handed:  Right  AIMS (if indicated): Not done Unable to assess due to telephone visit  Assets:  Communication Skills Desire  for Improvement Financial Resources/Insurance Housing Social Support  ADL's:  Intact  Cognition: WNL  Sleep:  Fair   Screenings: AIMS    Flowsheet Row Admission (Discharged) from 10/31/2017 in [facility]  AIMS Total Score 0      AUDIT    Flowsheet Row Admission (Discharged) from 10/31/2017 in [facility]  Alcohol Use Disorder Identification Test Final Score (AUDIT) 0      GAD-7    Flowsheet Row Video Visit from 08/08/2022 in [facility] Office Visit from 02/05/2022 in [place] from 11/07/2021 in [place] from 08/06/2021 in [facility] Counselor from 07/17/2021 in [facility]  Total GAD-7 Score '9 6 5 3 5      '$ PHQ2-9    Flowsheet Row Video Visit from 08/08/2022 in [facility] Office Visit from 02/05/2022 in [place] from 11/07/2021 in [place] from 08/06/2021 in [facility] Counselor from 07/17/2021 in [facility]  PHQ-2 Total Score 0 '2 2 1 4  '$ PHQ-9 Total Score '10 12 11 7 17      '$ Flowsheet Row Clinical Support from 02/13/2021 in [facility] Counselor from 09/18/2020 in [place] Error: Q7 should not be populated when Q6 is No No Risk        Assessment and Plan: Patient notes that she has been anxious about an upcoming court case but reports she is able to cope with it. No medication changes made today. Patient agreeable to continue medications as prescribed.    1. Attention deficit hyperactivity disorder (ADHD), predominantly inattentive type  Continue- atomoxetine  (STRATTERA) 40 MG capsule; Take 1 capsule (40 mg total) by mouth daily.  Dispense:  30 capsule; Refill: 3  2. Generalized anxiety disorder  Continue- FLUoxetine (PROZAC) 40 MG capsule; Take 1 capsule (40 mg total) by mouth daily.  Dispense: 30 capsule; Refill: 3 Continue- hydrOXYzine (ATARAX) 25 MG tablet; Take 1 tablet (25 mg total) by mouth 3 (three) times daily as needed.  Dispense: 90 tablet; Refill: 3  3. Mild depression  Continue- FLUoxetine (PROZAC) 40 MG capsule; Take 1 capsule (40 mg total) by mouth daily.  Dispense: 30 capsule; Refill: 3 Continue- traZODone (DESYREL) 100 MG tablet; Take 1 tablet (100 mg total) by mouth at bedtime.  Dispense: 30 tablet; Refill: 3      Follow up in 3 months Follow up with therapy  Salley Slaughter, NP 08/08/2022, 9:08 AM

## 2022-08-20 ENCOUNTER — Telehealth (HOSPITAL_COMMUNITY): Payer: Self-pay

## 2022-08-20 ENCOUNTER — Other Ambulatory Visit (HOSPITAL_COMMUNITY): Payer: Self-pay | Admitting: Psychiatry

## 2022-08-20 DIAGNOSIS — F32A Depression, unspecified: Secondary | ICD-10-CM

## 2022-08-20 DIAGNOSIS — F900 Attention-deficit hyperactivity disorder, predominantly inattentive type: Secondary | ICD-10-CM

## 2022-08-20 DIAGNOSIS — F411 Generalized anxiety disorder: Secondary | ICD-10-CM

## 2022-08-20 MED ORDER — TRAZODONE HCL 100 MG PO TABS: 100.0000 mg | ORAL_TABLET | Freq: Every day | ORAL | 3 refills | Status: DC

## 2022-08-20 MED ORDER — ATOMOXETINE HCL 40 MG PO CAPS: 40.0000 mg | ORAL_CAPSULE | Freq: Every day | ORAL | 3 refills | Status: DC

## 2022-08-20 MED ORDER — HYDROXYZINE HCL 25 MG PO TABS: 25.0000 mg | ORAL_TABLET | Freq: Three times a day (TID) | ORAL | 3 refills | Status: DC | PRN

## 2022-08-20 MED ORDER — FLUOXETINE HCL 40 MG PO CAPS: ORAL_CAPSULE | ORAL | 3 refills | Status: DC

## 2022-08-20 NOTE — Telephone Encounter (Signed)
Medication refilled and sent to preferred pharmacy

## 2022-10-31 ENCOUNTER — Encounter (HOSPITAL_COMMUNITY): Payer: Self-pay | Admitting: Student in an Organized Health Care Education/Training Program

## 2022-10-31 ENCOUNTER — Telehealth (INDEPENDENT_AMBULATORY_CARE_PROVIDER_SITE_OTHER): Payer: BLUE CROSS/BLUE SHIELD | Admitting: Student in an Organized Health Care Education/Training Program

## 2022-10-31 DIAGNOSIS — F32A Depression, unspecified: Secondary | ICD-10-CM

## 2022-10-31 DIAGNOSIS — F411 Generalized anxiety disorder: Secondary | ICD-10-CM

## 2022-10-31 MED ORDER — FLUOXETINE HCL 40 MG PO CAPS: ORAL_CAPSULE | ORAL | 3 refills | Status: DC

## 2022-10-31 MED ORDER — TRAZODONE HCL 50 MG PO TABS: 50.0000 mg | ORAL_TABLET | Freq: Every day | ORAL | 2 refills | Status: DC

## 2022-10-31 NOTE — Progress Notes (Signed)
[facility] MD/PA/NP OP Progress Note  10/31/2022 3:21 PM Angela Romero  MRN:  [id number]  Chief Complaint:  Chief Complaint  Patient presents with   Follow-up   Virtual Visit via Telephone Note  I connected with Angela Romero on 10/31/22 at 11:30 AM EST by telephone and verified that I am speaking with the correct person using two identifiers.  Location: Patient: Home Provider: Office   I discussed the limitations, risks, security and privacy concerns of performing an evaluation and management service by telephone and the availability of in person appointments. I also discussed with the patient that there may be a patient responsible charge related to this service. The patient expressed understanding and agreed to proceed.   History of Present Illness: Angela Romero is a 46 year old female seen today for follow up psychiatric evaluation.She has a psychiatric history or ADHD, depression, SI, Cannabis use (in remission), and adjustment disorder.  She is currently being manage on Prozac 40 mg daily, hydroxyzine 25 mg three times daily as needed, Strattera 40 mg daily, and Trazodone 50 mg nightly.  Patient notes that her medications are effective in managing her psychiatric conditions  Patient reports that she has not been taking the following medications:  Prozac 40 mg daily Trazodone 50 mg nightly as needed  Patient reports that she is having to cut her trazodone in half because she finds herself feeling more drowsy in the morning.  Patient reports that she is no longer taking Strattera or Atarax.  Patient reports that she has been concerned because she feels more fatigued during the day and has noticed "I am shaking more on my hand."  Patient reports that she also has been feeling more weak and has had to sit down.  Patient reports that the only trigger she has been able to recognize is that she often feels hungry around this time that she is feeling weaker.  Patient provider discussed  that this may have something to do with patient's blood sugar or other causes, but will require further workup with her PCP who she will be seeing in the next 2 weeks.  Patient reports that she is wondering if this is due to her medications however, provider expressed that this is unlikely.  Patient reports that otherwise, her mood has been good.  Patient reports she has not been anxious or worried and denies any recent headaches.  Patient reports that she does not feel that she can sleep well without her trazodone but does not like the drowsiness she has in the a.m.  Patient reports that her appetite has been stable.  Patient does not report any history of hypomanic episodes or manic episodes when screening.  Patient denies any SI, HI or AVH and has assessment today.   I discussed the assessment and treatment plan with the patient. The patient was provided an opportunity to ask questions and all were answered. The patient agreed with the plan and demonstrated an understanding of the instructions.   The patient was advised to call back or seek an in-person evaluation if the symptoms worsen or if the condition fails to improve as anticipated.  I provided 20 minutes of non-face-to-face time during this encounter.   Freida Busman, MD  Visit Diagnosis:    ICD-10-CM   1. Generalized anxiety disorder  F41.1 FLUoxetine (PROZAC) 40 MG capsule    2. Mild depression  F32.A FLUoxetine (PROZAC) 40 MG capsule    traZODone (DESYREL) 50 MG tablet  Past Psychiatric History: ADHD, depression, SI, Cannabis use (in remission), and adjustment disorder   Past Medical History:  Past Medical History:  Diagnosis Date   Anemia    Depression    Medical history non-contributory    Memory difficulty    Problems with hearing     Past Surgical History:  Procedure Laterality Date   EYE SURGERY     INNER EAR SURGERY  1996    x 4 other 3 as a child   REFRACTIVE SURGERY  07/2014    Family Psychiatric  History: Son Depression and daughter depression and SI    Family History:  Family History  Problem Relation Age of Onset   Diabetes Mellitus II Mother    Prostate cancer Father    Depression Child    Colon cancer Neg Hx     Social History:  Social History   Socioeconomic History   Marital status: Married    Spouse name: Not on file   Number of children: 3   Years of education: Not on file   Highest education level: Not on file  Occupational History   Occupation: vendor    Comment: hot dog  Tobacco Use   Smoking status: Former    Types: Cigarettes    Start date: 09/11/2020   Smokeless tobacco: Never  Vaping Use   Vaping Use: Never used  Substance and Sexual Activity   Alcohol use: Yes    Alcohol/week: 1.0 standard drink of alcohol    Types: 1 Shots of liquor per week    Comment: 2-3 ime per month   Drug use: No   Sexual activity: Yes    Birth control/protection: None  Other Topics Concern   Not on file  Social History Narrative   ** Merged History Encounter **       Social Determinants of Health   Financial Resource Strain: Low Risk  (09/18/2020)   Overall Financial Resource Strain (CARDIA)    Difficulty of Paying Living Expenses: Not hard at all  Food Insecurity: No Food Insecurity (09/18/2020)   Hunger Vital Sign    Worried About Running Out of Food in the Last Year: Never true    Ran Out of Food in the Last Year: Never true  Transportation Needs: No Transportation Needs (09/18/2020)   PRAPARE - Hydrologist (Medical): No    Lack of Transportation (Non-Medical): No  Physical Activity: Inactive (09/18/2020)   Exercise Vital Sign    Days of Exercise per Week: 0 days    Minutes of Exercise per Session: 0 min  Stress: Stress Concern Present (09/18/2020)   [place]    Feeling of Stress : To some extent  Social Connections: Socially Isolated (09/18/2020)   Social  Connection and Isolation Panel [NHANES]    Frequency of Communication with Friends and Family: Once a week    Frequency of Social Gatherings with Friends and Family: Never    Attends Religious Services: Never    Marine scientist or Organizations: No    Attends Music therapist: Never    Marital Status: Married    Allergies: No Known Allergies  Metabolic Disorder Labs: Lab Results  Component Value Date   HGBA1C 5.7 (H) 11/01/2017   MPG 116.89 11/01/2017   No results found for: "PROLACTIN" Lab Results  Component Value Date   CHOL 170 11/01/2017   TRIG 128 11/01/2017   HDL 56 11/01/2017  CHOLHDL 3.0 11/01/2017   VLDL 26 11/01/2017   LDLCALC 88 11/01/2017   Lab Results  Component Value Date   TSH 3.43 10/20/2018   TSH 3.808 11/01/2017    Therapeutic Level Labs: No results found for: "LITHIUM" No results found for: "VALPROATE" No results found for: "CBMZ"  Current Medications: Current Outpatient Medications  Medication Sig Dispense Refill   albuterol (VENTOLIN HFA) 108 (90 Base) MCG/ACT inhaler Inhale 1 puff into the lungs every 6 (six) hours as needed for wheezing or shortness of breath.     aspirin EC 81 MG tablet Take 81 mg by mouth daily.     atomoxetine (STRATTERA) 40 MG capsule Take 1 capsule (40 mg total) by mouth daily. 30 capsule 3   FLUoxetine (PROZAC) 40 MG capsule TAKE 1 CAPSULE BY MOUTH ONCE DAILY MAXIMUM  REFILLS  REACHED 30 capsule 3   hydrOXYzine (ATARAX) 25 MG tablet Take 1 tablet (25 mg total) by mouth 3 (three) times daily as needed. 90 tablet 3   Multiple Vitamin (MULTIVITAMIN) tablet Take 1 tablet by mouth daily.     Red Yeast Rice Extract (RED YEAST RICE PO) Take 1 capsule by mouth daily.     traZODone (DESYREL) 50 MG tablet Take 1 tablet (50 mg total) by mouth at bedtime. 30 tablet 2   No current facility-administered medications for this visit.     Musculoskeletal: defer  Psychiatric Specialty Exam: Review of Systems   Constitutional:  Positive for fatigue.  Psychiatric/Behavioral:  Negative for hallucinations and suicidal ideas.     unknown if currently breastfeeding.There is no height or weight on file to calculate BMI.  General Appearance: NA  Eye Contact:  NA  Speech:  Slow  Volume:  Decreased  Mood:  Euthymic  Affect:  NA  Thought Process:  Goal Directed  Orientation:  Full (Time, Place, and Person)  Thought Content:  Concrete    Suicidal Thoughts:  No  Homicidal Thoughts:  No  Memory:  Immediate;   Fair Recent;   Fair  Judgement:  Fair  Insight:  Lacking  Psychomotor Activity:  Psychomotor Retardation  Concentration:  Concentration: Fair  Recall:  NA  Fund of Knowledge: Poor  Language: Fair  Akathisia:  No  Handed:    AIMS (if indicated): not done  Assets:  Housing Resilience  ADL's:  Intact  Cognition: Unknown,  Sleep:  Fair   Screenings: [place] Admission (Discharged) from 10/31/2017 in [place] [facility]  AIMS Total Score 0      AUDIT    Flowsheet Row Admission (Discharged) from 10/31/2017 in [place] [facility]  Alcohol Use Disorder Identification Test Final Score (AUDIT) 0      GAD-7    Flowsheet Row Video Visit from 08/08/2022 in [facility] Office Visit from 02/05/2022 in [place] from 11/07/2021 in [place] from 08/06/2021 in [facility] Counselor from 07/17/2021 in [facility]  Total GAD-7 Score '9 6 5 3 5      '$ PHQ2-9    Flowsheet Row Video Visit from 08/08/2022 in [facility] Office Visit from 02/05/2022 in [place] from 11/07/2021 in [place] from 08/06/2021 in [facility] Counselor from 07/17/2021 in [facility]  PHQ-2 Total Score  0 '2 2 1 4  '$ PHQ-9 Total Score '10 12 11 7 17      '$ Flowsheet Row Clinical Support from 02/13/2021 in [facility] Counselor from 09/18/2020 in [place] Error: Q7 should not be populated when Q6 is No No Risk        Assessment and Plan:  Klaudia Beirne is a 46 year old female seen today for follow up psychiatric evaluation.She has a psychiatric history or ADHD, depression, SI, Cannabis use (in remission), and adjustment disorder.  Based on assessment today patient psychiatric issues appear to be stable, patient's main concerns will likely require further workup with her PCP and there is decreased concern by this provider that they are secondary to her medications based on description provided today during the visit.  We will decrease patient's trazodone as it may be oversedating her however, it does help patient sleeping feel well rested.  ADHD GAD - Continue Prozac 40 mg daily  Insomnia - Decrease trazodone to 25-50 mg nightly as needed  Follow-up in approximately 1-2 months at the [place] office, in person  Collaboration of Care: Collaboration of Care:   Patient/Guardian was advised Release of Information must be obtained prior to any record release in order to collaborate their care with an outside provider. Patient/Guardian was advised if they have not already done so to contact the registration department to sign all necessary forms in order for [place] to release information regarding their care.   Consent: Patient/Guardian gives verbal consent for treatment and assignment of benefits for services provided during this visit. Patient/Guardian expressed understanding and agreed to proceed.    PGY- [place], MD 10/31/2022, 3:21 PM

## 2022-11-03 ENCOUNTER — Telehealth (HOSPITAL_COMMUNITY): Payer: BLUE CROSS/BLUE SHIELD | Admitting: Psychiatry

## 2022-12-08 ENCOUNTER — Encounter (HOSPITAL_COMMUNITY): Payer: Self-pay | Admitting: Student in an Organized Health Care Education/Training Program

## 2022-12-08 ENCOUNTER — Ambulatory Visit (HOSPITAL_BASED_OUTPATIENT_CLINIC_OR_DEPARTMENT_OTHER): Payer: BLUE CROSS/BLUE SHIELD | Admitting: Student in an Organized Health Care Education/Training Program

## 2022-12-08 VITALS — BP 132/84 | HR 80 | Resp 12 | Wt 155.0 lb

## 2022-12-08 DIAGNOSIS — F331 Major depressive disorder, recurrent, moderate: Secondary | ICD-10-CM | POA: Diagnosis not present

## 2022-12-08 MED ORDER — FLUOXETINE HCL 10 MG PO CAPS: 10.0000 mg | ORAL_CAPSULE | Freq: Every day | ORAL | 0 refills | Status: DC

## 2022-12-08 MED ORDER — FLUOXETINE HCL 20 MG PO CAPS: 20.0000 mg | ORAL_CAPSULE | Freq: Every day | ORAL | 2 refills | Status: DC

## 2022-12-08 NOTE — Patient Instructions (Signed)
Medication  Take Prozac '10mg'$  for 7 days then increase to Prozac '20mg'$  daily

## 2022-12-08 NOTE — Progress Notes (Signed)
[facility] MD/PA/NP OP Progress Note  12/08/2022 10:32 AM Angela Romero  MRN:  [id number]  Chief Complaint:  Chief Complaint  Patient presents with   Follow-up   HPI: Angela Romero is a 46 year old female seen today for follow up psychiatric evaluation.She has a psychiatric history or ADHD, depression, SI, Cannabis use (in remission), and adjustment disorder.   Patient reports that she has not been taking the following medications prescribed:   Prozac 40 mg daily Trazodone 50 mg nightly as needed    On assessment patient reports that she went on a trip and forgot her medication and then did not take the medication when she came back due to fear for how long she had not been on it.  Patient reports she believes she is going approximately 21 days without the medication.  Patient reports that she did follow up with her primary care provider in regards to her tremor and endorses the medication they gave her was beneficial and she is doing better.  Patient reports that without her medications for mood she has been feeling more depressed but is not feeling anxious.  Patient reports that her concentration has also been fairly poor.  Patient endorses significant anhedonia, feeling hopeless reporting "everything is the same every day.  Patient reports her energy has been fairly low but her appetite is good.  Patient denies passive SI, SI, HI and AVH.  Patient reports that she usually spends most of her day on her cell phone playing games.  Patient reports that her husband does not like her interacting with others and endorses that even when she takes phone calls he may listen in or "make fun of me."  Patient reports that she was 46 years old when she married her husband and he was 34.  Patient reports that she and her husband are both originally from [place] and she has no family in the area, but she does occasionally talk to her only brother who remains in that country.  Patient reports that her family will go  back to [place] every year or so.  Patient reports that her 3 children still lives with him including her 80 year old son, her 22 year old daughter, and her 45 year old daughter.  Patient reports that everyone tends to keep to themselves and she is not sure if her kids have noticed any changes in her since not taking her Prozac.  Patient and provider discussed patient's concern for every day going the same.  Patient reports that she thinks her husband would be okay with her working and that during the summer she works at the [employer].  Patient reports that she enjoys this.  Patient and provider discussed considering talking to her children about applying for a job.  Patient reports that it is possible that her husband would be okay with her working at a place where her children already work.  Patient then reports that she is not sure she will be able to work in the winter because it is cold outside.  However, despite this patient reports that she will at least bring up the conversation with her husband since she does believe he would like for her to work and make money.  Patient reports she would be interested in therapy to help with her low motivation and anhedonia.Patient reports that really the only thing that brings her some joy is thinking about her previous extramarital affair and how she wishes that individual would contact her again, even if they could only  be friends. Patient reports that she would love to learn methods to help her decrease the amount of time she spens thinking about him.  Visit Diagnosis:    ICD-10-CM   1. Moderate episode of recurrent major depressive disorder (HCC)  F33.1 FLUoxetine (PROZAC) 10 MG capsule    FLUoxetine (PROZAC) 20 MG capsule      Past Psychiatric History: ADHD, depression, SI, Cannabis use (in remission), and adjustment disorder   History of BuSpar for anxiety has been beneficial in the past History of Strattera 40 mg but had tremor with this  medication Per documentation 10/2020 patient was also endorsing a lot of physical symptoms such as tremor, and feeling weak  -Patient reports she had illness when she was fairly young has had chronic memory issues likely due to this.  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  Past Medical History:  Past Medical History:  Diagnosis Date   Anemia    Depression    Medical history non-contributory    Memory difficulty    Problems with hearing     Past Surgical History:  Procedure Laterality Date   EYE SURGERY     INNER EAR SURGERY  1996    x 4 other 3 as a child   REFRACTIVE SURGERY  07/2014    Family Psychiatric History:  Son Depression and daughter depression and SI   Family History:  Family History  Problem Relation Age of Onset   Diabetes Mellitus II Mother    Prostate cancer Father    Depression Child    Colon cancer Neg Hx     Social History:  Social History   Socioeconomic History   Marital status: Married    Spouse name: Not on file   Number of children: 3   Years of education: Not on file   Highest education level: Not on file  Occupational History   Occupation: vendor    Comment: hot dog  Tobacco Use   Smoking status: Former    Types: Cigarettes    Start date: 09/11/2020   Smokeless tobacco: Never  Vaping Use   Vaping Use: Never used  Substance and Sexual Activity   Alcohol use: Yes    Alcohol/week: 1.0 standard drink of alcohol    Types: 1 Shots of liquor per week    Comment: 2-3 ime per month   Drug use: No   Sexual activity: Yes    Birth control/protection: None  Other Topics Concern   Not on file  Social History Narrative   ** Merged History Encounter **       Social Determinants of Health   Financial Resource Strain: Low Risk  (09/18/2020)   Overall Financial Resource Strain (CARDIA)    Difficulty of Paying Living Expenses: Not hard at all  Food Insecurity: No  Food Insecurity (09/18/2020)   Hunger Vital Sign    Worried About Running Out of Food in the Last Year: Never true    Ran Out of Food in the Last Year: Never true  Transportation Needs: No Transportation Needs (09/18/2020)   PRAPARE - Hydrologist (Medical): No    Lack of Transportation (Non-Medical): No  Physical Activity: Inactive (09/18/2020)   Exercise Vital Sign    Days of Exercise per Week: 0 days    Minutes of Exercise per Session: 0 min  Stress: Stress Concern Present (09/18/2020)   [place]  Institute of Occupational Health - Occupational Stress Questionnaire    Feeling of Stress : To some extent  Social Connections: Socially Isolated (09/18/2020)   Social Connection and Isolation Panel [NHANES]    Frequency of Communication with Friends and Family: Once a week    Frequency of Social Gatherings with Friends and Family: Never    Attends Religious Services: Never    Marine scientist or Organizations: No    Attends Music therapist: Never    Marital Status: Married    Allergies: No Known Allergies  Metabolic Disorder Labs: Lab Results  Component Value Date   HGBA1C 5.7 (H) 11/01/2017   MPG 116.89 11/01/2017   No results found for: "PROLACTIN" Lab Results  Component Value Date   CHOL 170 11/01/2017   TRIG 128 11/01/2017   HDL 56 11/01/2017   CHOLHDL 3.0 11/01/2017   VLDL 26 11/01/2017   [place] 88 11/01/2017   Lab Results  Component Value Date   TSH 3.43 10/20/2018   TSH 3.808 11/01/2017    Therapeutic Level Labs: No results found for: "LITHIUM" No results found for: "VALPROATE" No results found for: "CBMZ"  Current Medications: Current Outpatient Medications  Medication Sig Dispense Refill   FLUoxetine (PROZAC) 10 MG capsule Take 1 capsule (10 mg total) by mouth daily for 7 days. 7 capsule 0   [START ON 12/15/2022] FLUoxetine (PROZAC) 20 MG capsule Take 1 capsule (20 mg total) by mouth daily. 30 capsule 2    albuterol (VENTOLIN HFA) 108 (90 Base) MCG/ACT inhaler Inhale 1 puff into the lungs every 6 (six) hours as needed for wheezing or shortness of breath.     aspirin EC 81 MG tablet Take 81 mg by mouth daily.     atomoxetine (STRATTERA) 40 MG capsule Take 1 capsule (40 mg total) by mouth daily. 30 capsule 3   FLUoxetine (PROZAC) 40 MG capsule TAKE 1 CAPSULE BY MOUTH ONCE DAILY MAXIMUM  REFILLS  REACHED 30 capsule 3   hydrOXYzine (ATARAX) 25 MG tablet Take 1 tablet (25 mg total) by mouth 3 (three) times daily as needed. 90 tablet 3   Multiple Vitamin (MULTIVITAMIN) tablet Take 1 tablet by mouth daily.     Red Yeast Rice Extract (RED YEAST RICE PO) Take 1 capsule by mouth daily.     traZODone (DESYREL) 50 MG tablet Take 1 tablet (50 mg total) by mouth at bedtime. 30 tablet 2   No current facility-administered medications for this visit.     Musculoskeletal: Strength & Muscle Tone: within normal limits Gait & Station: normal Patient leans: N/A  Psychiatric Specialty Exam: Review of Systems  Blood pressure 132/84, pulse 80, resp. rate 12, weight 155 lb (70.3 kg), SpO2 97 %, unknown if currently breastfeeding.Body mass index is 28.35 kg/m.  General Appearance: Casual  Eye Contact:  Fair  Speech:  Clear and Coherent  Volume:  Decreased  Mood:  Depressed  Affect:  Congruent  Thought Process:  Goal Directed  Orientation:  Full (Time, Place, and Person)  Thought Content:  Concrete    Suicidal Thoughts:  No  Homicidal Thoughts:  No  Memory:  Immediate;   Fair Recent;   Fair  Judgement:  Impaired  Insight:  Lacking  Psychomotor Activity:  Psychomotor Retardation  Concentration:  Concentration: Poor  Recall:  NA  Fund of Knowledge: Poor  Language: Good  Akathisia:  No  Handed:    AIMS (if indicated): not done  Assets:  Desire for Improvement Housing Resilience  Transportation  ADL's:  Intact  Cognition: Impaired,  Mild  Sleep:  Poor   Screenings: AIMS    Flowsheet Row  Admission (Discharged) from 10/31/2017 in [place] [facility]  AIMS Total Score 0      AUDIT    Flowsheet Row Admission (Discharged) from 10/31/2017 in [place] [facility]  Alcohol Use Disorder Identification Test Final Score (AUDIT) 0      GAD-7    Flowsheet Row Video Visit from 08/08/2022 in [facility] Office Visit from 02/05/2022 in [facility] Clinical Support from 11/07/2021 in [place] from 08/06/2021 in [facility] Counselor from 07/17/2021 in [facility]  Total GAD-7 Score '9 6 5 3 5      '$ PHQ2-9    Flowsheet Row Video Visit from 08/08/2022 in [facility] Office Visit from 02/05/2022 in [place] from 11/07/2021 in [place] from 08/06/2021 in [facility] Counselor from 07/17/2021 in [place]  PHQ-2 Total Score 0 '2 2 1 4  '$ PHQ-9 Total Score '10 12 11 7 17      '$ Flowsheet Row Clinical Support from 02/13/2021 in [facility] Counselor from 09/18/2020 in [place] Error: Q7 should not be populated when Q6 is No No Risk        Assessment and Plan:  Angela Romero is a 46 year old female seen today for follow up psychiatric evaluation.She has a psychiatric history or ADHD, depression, SI, Cannabis use (in remission), and adjustment disorder. On assessment today patient appears dysphoric but endorses that she did feel a bit better with medication. Patient will be restarted on medication with a titration up. Patient also endorses multiple social factors and personal low motivation factors that may be contributing to  her low mood. Patient may benefit from therapy especially CBT to assist with treatment. Based on chart review patient appears to a have a long hx of somatization of her depression and anxiety but has very poor insight into this.   MDD, recurrent, moderate Hx of GAD Hx of ADHD - Restart Prozac at '10mg'$  x 7 days then increase to '20mg'$  daily  Insomnia - Trazodone 25-'50mg'$  QHS PRN  F/u in 4-6 weeks Consider referral to resident therapy clinic  Collaboration of Care: Collaboration of Care:   Patient/Guardian was advised Release of Information must be obtained prior to any record release in order to collaborate their care with an outside provider. Patient/Guardian was advised if they have not already done so to contact the registration departmePatient reports that lately the only thing that brings him much joy is thinking about a previous extramarital affair that has since ended.  Patient reports she spends a lot of time thinking about whether or not this person will recontact her again.nt to sign all necessary forms in order for [place] to release information regarding their care.   Consent: Patient/Guardian gives verbal consent for treatment and assignment of benefits for services provided during this visit. Patient/Guardian expressed understanding and agreed to proceed.   PGY-3 Freida Busman, MD 12/08/2022, 10:32 AM [place]

## 2023-01-26 ENCOUNTER — Ambulatory Visit (HOSPITAL_BASED_OUTPATIENT_CLINIC_OR_DEPARTMENT_OTHER): Payer: BLUE CROSS/BLUE SHIELD | Admitting: Student in an Organized Health Care Education/Training Program

## 2023-01-26 DIAGNOSIS — F331 Major depressive disorder, recurrent, moderate: Secondary | ICD-10-CM

## 2023-01-26 MED ORDER — FLUOXETINE HCL 20 MG PO CAPS: 20.0000 mg | ORAL_CAPSULE | Freq: Every day | ORAL | 3 refills | Status: DC
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
## 2023-03-11 ENCOUNTER — Encounter (HOSPITAL_COMMUNITY): Payer: Self-pay | Admitting: Student in an Organized Health Care Education/Training Program

## 2023-03-11 ENCOUNTER — Ambulatory Visit (HOSPITAL_BASED_OUTPATIENT_CLINIC_OR_DEPARTMENT_OTHER): Payer: BLUE CROSS/BLUE SHIELD | Admitting: Student in an Organized Health Care Education/Training Program

## 2023-03-11 VITALS — BP 117/78 | HR 101 | Wt 158.0 lb

## 2023-03-11 DIAGNOSIS — F332 Major depressive disorder, recurrent severe without psychotic features: Secondary | ICD-10-CM

## 2023-03-11 MED ORDER — FLUOXETINE HCL 10 MG PO CAPS: 30.0000 mg | ORAL_CAPSULE | Freq: Every day | ORAL | 1 refills | Status: DC
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
## 2023-04-15 ENCOUNTER — Ambulatory Visit (HOSPITAL_COMMUNITY): Payer: BLUE CROSS/BLUE SHIELD | Admitting: Student in an Organized Health Care Education/Training Program

## 2023-05-06 ENCOUNTER — Ambulatory Visit (HOSPITAL_BASED_OUTPATIENT_CLINIC_OR_DEPARTMENT_OTHER): Payer: BLUE CROSS/BLUE SHIELD | Admitting: Student in an Organized Health Care Education/Training Program

## 2023-05-06 VITALS — BP 133/82 | HR 92 | Resp 16 | Wt 160.0 lb

## 2023-05-06 DIAGNOSIS — F332 Major depressive disorder, recurrent severe without psychotic features: Secondary | ICD-10-CM

## 2023-05-06 DIAGNOSIS — F632 Kleptomania: Secondary | ICD-10-CM | POA: Insufficient documentation

## 2023-05-06 MED ORDER — NALTREXONE HCL 50 MG PO TABS: 50.0000 mg | ORAL_TABLET | Freq: Every day | ORAL | 2 refills | Status: DC

## 2023-05-06 MED ORDER — FLUOXETINE HCL 10 MG PO CAPS: 30.0000 mg | ORAL_CAPSULE | Freq: Every day | ORAL | 1 refills | Status: DC
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
## 2023-05-07 NOTE — Addendum Note (Signed)
Addended by: Jearline Hirschhorn on: 05/07/2023 09:47 AM   Modules accepted: Level of Service  

## 2023-05-20 LAB — AMB RESULTS CONSOLE CBG: Glucose: 157

## 2023-05-20 NOTE — Progress Notes (Signed)
Pt has PCP, not sure of name. Pt does not have SDOH needs right now.

## 2023-06-04 ENCOUNTER — Encounter (HOSPITAL_COMMUNITY): Payer: Self-pay

## 2023-06-04 ENCOUNTER — Telehealth (HOSPITAL_COMMUNITY): Payer: BLUE CROSS/BLUE SHIELD | Admitting: Student in an Organized Health Care Education/Training Program
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
## 2023-06-04 NOTE — Progress Notes (Unsigned)
Patient showed up in person to the wrong office for a virtual visit. Provider did speak with patient over the phone. Patient was not able to do visit virtually. Patient denied SI, and endorsed that she would be interested in rescheduling and starting therapy.  PGY-3  Eliseo Gum, MD

## 2023-06-15 NOTE — Progress Notes (Unsigned)
Pt attended screening event on 05/20/23, where Pt screening results were BP 129/88 and Glucose 157. At the event, Pt shared that she has PCP but not sure of the name and no SDOH insecurities was indicated. Per chart review, [facility] was listed as PCP in [facility]. Donnelly Stager, PA-C at [facility] is also identified as PCP and Pt was seen by this provider on 03/04/23. Pt was contacted by phone for PCP status and Blood Sugar follow up. LMTC

## 2023-07-10 ENCOUNTER — Ambulatory Visit (HOSPITAL_COMMUNITY): Payer: BLUE CROSS/BLUE SHIELD | Admitting: Student in an Organized Health Care Education/Training Program

## 2023-07-17 ENCOUNTER — Ambulatory Visit (HOSPITAL_COMMUNITY): Payer: BLUE CROSS/BLUE SHIELD | Admitting: Student in an Organized Health Care Education/Training Program

## 2023-07-21 LAB — AMB RESULTS CONSOLE CBG: Glucose: 139

## 2023-07-21 NOTE — Progress Notes (Signed)
Pt does not know PCPs name. Pt declined SDOH questionnaire. Pt given lifestyle medicine handout and education to help control her diabetes.

## 2023-08-03 ENCOUNTER — Telehealth (HOSPITAL_COMMUNITY): Payer: Self-pay | Admitting: Student in an Organized Health Care Education/Training Program

## 2023-08-03 ENCOUNTER — Other Ambulatory Visit (HOSPITAL_COMMUNITY): Payer: Self-pay | Admitting: Student in an Organized Health Care Education/Training Program

## 2023-08-03 DIAGNOSIS — F332 Major depressive disorder, recurrent severe without psychotic features: Secondary | ICD-10-CM

## 2023-08-03 DIAGNOSIS — F632 Kleptomania: Secondary | ICD-10-CM

## 2023-08-03 MED ORDER — NALTREXONE HCL 50 MG PO TABS: 50.0000 mg | ORAL_TABLET | Freq: Every day | ORAL | 2 refills | Status: DC

## 2023-08-03 MED ORDER — FLUOXETINE HCL 10 MG PO CAPS: 30.0000 mg | ORAL_CAPSULE | Freq: Every day | ORAL | 1 refills | Status: DC

## 2023-08-03 NOTE — Telephone Encounter (Signed)
Done, thanks

## 2023-08-05 ENCOUNTER — Encounter: Payer: Self-pay | Admitting: *Deleted

## 2023-08-05 NOTE — Progress Notes (Signed)
Pt attended 07/21/2023 event where her b/p was 126/84 and her blood sugar was 139. At the event, the pt could not remember her PCP name from [facility] and did not identify any SDOH. (This note also serves as pt's 60 day f/u from the 05/20/23 health equity screening event she attended.) Chart review indicates pt's PCP is Cherylann Parr, FNP from the [facility].Pt last saw her PCP on 07/10/23, when FNP [facility] gave pt referral appt to ortho on 08/18/2023. She also has ongoing care with [facility] Dr. Eliseo Gum, with her last in office visit being 05/06/23; and she has future appt with Dr. Morrie Sheldon on 913/24 and 09/25/23. No additional health equity team support indicated at this time.

## 2023-09-04 ENCOUNTER — Encounter (HOSPITAL_COMMUNITY): Payer: BLUE CROSS/BLUE SHIELD | Admitting: Student in an Organized Health Care Education/Training Program

## 2023-09-25 ENCOUNTER — Ambulatory Visit (HOSPITAL_COMMUNITY): Payer: BLUE CROSS/BLUE SHIELD | Admitting: Student in an Organized Health Care Education/Training Program

## 2023-10-16 ENCOUNTER — Ambulatory Visit (HOSPITAL_COMMUNITY): Payer: BLUE CROSS/BLUE SHIELD | Admitting: Student in an Organized Health Care Education/Training Program

## 2023-10-19 ENCOUNTER — Telehealth (HOSPITAL_COMMUNITY): Payer: Self-pay

## 2023-10-19 NOTE — Telephone Encounter (Signed)
Patient called for a refill on her Prozac. I advised patient that I would send you a message but that she missed her appointment in September and does not currently have one. Patient states she never got a reminder call. I verified her phone number and told her she might want to check her voicemail - we called. I transferred patient to front desk to make the follow up. Please review and advise, thank you

## 2023-10-20 ENCOUNTER — Other Ambulatory Visit (HOSPITAL_COMMUNITY): Payer: Self-pay | Admitting: Student in an Organized Health Care Education/Training Program

## 2023-10-20 DIAGNOSIS — F332 Major depressive disorder, recurrent severe without psychotic features: Secondary | ICD-10-CM

## 2023-10-20 MED ORDER — FLUOXETINE HCL 10 MG PO CAPS: 30.0000 mg | ORAL_CAPSULE | Freq: Every day | ORAL | 0 refills | Status: DC

## 2023-10-23 NOTE — Telephone Encounter (Signed)
I sent a refill on 10/29 since she is scheduled to f/u on 11/4.

## 2023-10-26 ENCOUNTER — Ambulatory Visit (HOSPITAL_BASED_OUTPATIENT_CLINIC_OR_DEPARTMENT_OTHER): Payer: BLUE CROSS/BLUE SHIELD | Admitting: Family

## 2023-10-26 ENCOUNTER — Encounter (HOSPITAL_COMMUNITY): Payer: Self-pay | Admitting: Family

## 2023-10-26 DIAGNOSIS — F332 Major depressive disorder, recurrent severe without psychotic features: Secondary | ICD-10-CM | POA: Diagnosis not present

## 2023-10-26 DIAGNOSIS — F411 Generalized anxiety disorder: Secondary | ICD-10-CM

## 2023-10-26 MED ORDER — HYDROXYZINE HCL 25 MG PO TABS: 25.0000 mg | ORAL_TABLET | Freq: Three times a day (TID) | ORAL | 3 refills | Status: AC | PRN

## 2023-10-26 MED ORDER — FLUOXETINE HCL 10 MG PO CAPS
30.0000 mg | ORAL_CAPSULE | Freq: Every day | ORAL | 2 refills | Status: DC
Start: 2023-10-26 — End: 2024-03-15

## 2023-10-26 NOTE — Progress Notes (Addendum)
BH MD/PA/NP OP Progress Note  10/26/2023 3:52 PM Angela Romero  MRN:  [id number]  Chief Complaint: Medication management    HPI: Angela Romero 47 year old female that presents for medication management appointment.  She is currenlty followed by psychiatrist  Eliseo Gum in office.  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  Currently she is prescribed Prozac 30 mg daily and hydroxyzine 25 mg as needed 3 times a day.  Does not appear that patient is complaint with medications. Stated "Can I take this medication every other day."  Patient is in inquiring about changing Prozac to a medication that would help her lose weight.  She reports concerns related to memory issues.  States she would like medications for her inability to focus.  Chart review patient was initiated on Strattera however states she was unable to tolerate the medication.  Patient was referred back to [place] attention specialist for ADHD testing.  She appeared receptive to plan.  She denied that she has been taking hydroxyzine as directed.  States she threw the medication away because she was unable to recall what the medication was for.  Chart review patient was initiated on Depade 50 mg due to diagnoses were kleptomania patient declined taking this medication as well.  Does report a history related to marijuana use and occasional alcohol use.  PHQ-9 17 GAD-7 6  Major depressive disorder: Generalized anxiety disorder:   Continue Prozac 30 mg daily Reordered hydroxyzine 25 mg p.o. 3 times daily as needed -Follow-up with [place] attention specialist for ADHD testing -Follow-up with primary care provider for referral to neurology due to reports of memory issues  During evaluation Tamberly Donalson is sitting; she is alert/oriented x 4; calm/cooperative; and mood congruent  with affect.  Patient is speaking in a clear tone at moderate volume, and normal pace; with good eye contact.Her thought process is coherent and relevant; There is no indication that she is currently responding to internal/external stimuli or experiencing delusional thought content.  Patient denies suicidal/self-harm/homicidal ideation, psychosis, and paranoia.  Patient has remained calm throughout assessment and has answered questions appropriately.   Visit Diagnosis:    ICD-10-CM   1. Severe recurrent major depression without psychotic features (HCC)  F33.2 FLUoxetine (PROZAC) 10 MG capsule    2. Generalized anxiety disorder  F41.1 hydrOXYzine (ATARAX) 25 MG tablet      Past Psychiatric History:   Past Medical History:  Past Medical History:  Diagnosis Date   Anemia    Depression    Medical history non-contributory    Memory difficulty    Problems with hearing     Past Surgical History:  Procedure Laterality Date   EYE SURGERY     INNER EAR SURGERY  1996    x 4 other 3 as a child   REFRACTIVE SURGERY  07/2014    Family Psychiatric History:   Family History:  Family History  Problem Relation Age of Onset   Diabetes Mellitus II Mother    Prostate cancer Father    Depression Child    Colon cancer Neg Hx     Social History:  Social History   Socioeconomic History   Marital status: Married    Spouse name: Not on file   Number of children: 3   Years of education: Not on file   Highest education level: Not on file  Occupational History   Occupation: vendor  Comment: hot dog  Tobacco Use   Smoking status: Former    Types: Cigarettes    Start date: 09/11/2020   Smokeless tobacco: Never  Vaping Use   Vaping status: Never Used  Substance and Sexual Activity   Alcohol use: Yes    Alcohol/week: 1.0 standard drink of alcohol    Types: 1 Shots of liquor per week    Comment: 2-3 ime per month   Drug use: No   Sexual activity: Yes    Birth control/protection: None   Other Topics Concern   Not on file  Social History Narrative   ** Merged History Encounter **       Social Determinants of Health   Financial Resource Strain: Low Risk  (09/18/2020)   Overall Financial Resource Strain (CARDIA)    Difficulty of Paying Living Expenses: Not hard at all  Food Insecurity: No Food Insecurity (05/20/2023)   Hunger Vital Sign    Worried About Running Out of Food in the Last Year: Never true    Ran Out of Food in the Last Year: Never true  Transportation Needs: No Transportation Needs (05/20/2023)   PRAPARE - Administrator, Civil Service (Medical): No    Lack of Transportation (Non-Medical): No  Physical Activity: Inactive (09/18/2020)   Exercise Vital Sign    Days of Exercise per Week: 0 days    Minutes of Exercise per Session: 0 min  Stress: Stress Concern Present (09/18/2020)   [employer] of Occupational Health - Occupational Stress Questionnaire    Feeling of Stress : To some extent  Social Connections: Unknown (04/28/2022)   Received from [facility]   Social Network    Social Network: Not on file    Allergies: No Known Allergies  Metabolic Disorder Labs: Lab Results  Component Value Date   HGBA1C 5.7 (H) 11/01/2017   MPG 116.89 11/01/2017   No results found for: "PROLACTIN" Lab Results  Component Value Date   CHOL 170 11/01/2017   TRIG 128 11/01/2017   HDL 56 11/01/2017   CHOLHDL 3.0 11/01/2017   VLDL 26 11/01/2017   LDLCALC 88 11/01/2017   Lab Results  Component Value Date   TSH 3.43 10/20/2018   TSH 3.808 11/01/2017    Therapeutic Level Labs: No results found for: "LITHIUM" No results found for: "VALPROATE" No results found for: "CBMZ"  Current Medications: Current Outpatient Medications  Medication Sig Dispense Refill   albuterol (VENTOLIN HFA) 108 (90 Base) MCG/ACT inhaler Inhale 1 puff into the lungs every 6 (six) hours as needed for wheezing or shortness of breath.     aspirin EC 81 MG  tablet Take 81 mg by mouth daily.     FLUoxetine (PROZAC) 10 MG capsule Take 3 capsules (30 mg total) by mouth daily. 90 capsule 2   hydrOXYzine (ATARAX) 25 MG tablet Take 1 tablet (25 mg total) by mouth 3 (three) times daily as needed. 90 tablet 3   Multiple Vitamin (MULTIVITAMIN) tablet Take 1 tablet by mouth daily.     Red Yeast Rice Extract (RED YEAST RICE PO) Take 1 capsule by mouth daily.     No current facility-administered medications for this visit.     Musculoskeletal: Strength & Muscle Tone: within normal limits Gait & Station: normal Patient leans: N/A  Psychiatric Specialty Exam: Review of Systems  Psychiatric/Behavioral:  Positive for decreased concentration and sleep disturbance. Negative for self-injury. The patient is nervous/anxious.   All other systems reviewed and are  negative.   Blood pressure 135/84, pulse 92, height 5\' 4"  (1.626 m), weight 163 lb (73.9 kg), unknown if currently breastfeeding.Body mass index is 27.98 kg/m.  General Appearance: Casual  Eye Contact:  Good  Speech:  Clear and Coherent  Volume:  Normal  Mood:  Anxious and Depressed  Affect:  Congruent  Thought Process:  Coherent  Orientation:  Full (Time, Place, and Person)  Thought Content: Logical   Suicidal Thoughts:  No  Homicidal Thoughts:  No  Memory:  Immediate;   Good Recent;   Good  Judgement:  Good  Insight:  Good  Psychomotor Activity:  Normal  Concentration:  Concentration: Good  Recall:  Fair  Fund of Knowledge: Good  Language: Good  Akathisia:  No  Handed:  Right  AIMS (if indicated): done  Assets:  Communication Skills Desire for Improvement Resilience Social Support  ADL's:  Intact  Cognition: WNL  Sleep:  Fair   Screenings: AIMS    Flowsheet Row [facility]  AIMS Total Score 0      AUDIT    Flowsheet Row [facility]  Alcohol Use Disorder Identification Test Final Score (AUDIT) 0      GAD-7    Flowsheet Row Video Visit from 08/08/2022 in [facility] Office Visit from 02/05/2022 in [facility] Clinical Support from 11/07/2021 in [facility] Clinical Support from 08/06/2021 in [facility] Counselor from 07/17/2021 in [facility]  Total GAD-7 Score 9 6 5 3 5       PHQ2-9    Flowsheet Row Office Visit from 03/11/2023 in [facility] Video Visit from 08/08/2022 in [facility] Office Visit from 02/05/2022 in [facility] Clinical Support from 11/07/2021 in [facility] Clinical Support from 08/06/2021 in [facility]  PHQ-2 Total Score 4 0 2 2 1   PHQ-9 Total Score 15 10 12 11 7       Flowsheet Row Clinical Support from 02/13/2021 in [facility] Counselor from 09/18/2020 in [facility]  C-SSRS RISK CATEGORY Error: Q7 should not be populated when Q6 is No No Risk        Assessment and Plan: Nathalee Smarr 47 year old female presents for medication management.  Currently prescribed Prozac and hydroxyzine for depression and anxiety.  It does not appear that patient is compliant with medication management.  She is seeking additional outpatient resources for ADHD testing.  Has multiple failed medication documented.  Reported hand tremors with naltrexone.  States she is starting to feel anxious with the Prozac.  Reported she has been taking this medication for over 5 years.   Patient is amendable to continuing Prozac 30 milligrams daily at this time Discontinue naltrexone 50 mgs patient reports she is not taking medications Patient to follow-up with  neurology for memory reported issues Discussed following up with [place] attention specialist and/or [facility] for ADHD testing  Collaboration of Care: Collaboration of Care: Medication Management AEB continue Prozac and hydroxyzine for depression and anxiety.  Patient/Guardian was advised Release of Information must be obtained prior to any record release in order to collaborate their care with an outside provider. Patient/Guardian was advised if they have not already done so to contact the registration department to  sign all necessary forms in order for [place] to release information regarding their care.   Consent: Patient/Guardian gives verbal consent for treatment and assignment of benefits for services provided during this visit. Patient/Guardian expressed understanding and agreed to proceed.    Oneta Rack, NP 10/26/2023, 3:52 PM

## 2023-12-07 ENCOUNTER — Telehealth (HOSPITAL_COMMUNITY): Payer: BLUE CROSS/BLUE SHIELD | Admitting: Family

## 2024-01-08 ENCOUNTER — Telehealth (HOSPITAL_COMMUNITY): Payer: Self-pay | Admitting: Psychiatry

## 2024-01-20 ENCOUNTER — Other Ambulatory Visit (HOSPITAL_COMMUNITY): Payer: Self-pay | Admitting: Family

## 2024-01-20 DIAGNOSIS — F32A Depression, unspecified: Secondary | ICD-10-CM

## 2024-01-25 ENCOUNTER — Telehealth (HOSPITAL_COMMUNITY): Payer: BLUE CROSS/BLUE SHIELD | Admitting: Family

## 2024-03-08 ENCOUNTER — Other Ambulatory Visit (HOSPITAL_COMMUNITY): Payer: Self-pay | Admitting: Family

## 2024-03-08 DIAGNOSIS — F332 Major depressive disorder, recurrent severe without psychotic features: Secondary | ICD-10-CM

## 2024-03-15 ENCOUNTER — Other Ambulatory Visit (HOSPITAL_COMMUNITY): Payer: Self-pay

## 2024-03-15 DIAGNOSIS — F332 Major depressive disorder, recurrent severe without psychotic features: Secondary | ICD-10-CM

## 2024-03-15 MED ORDER — FLUOXETINE HCL 10 MG PO CAPS
30.0000 mg | ORAL_CAPSULE | Freq: Every day | ORAL | 0 refills | Status: DC
Start: 2024-03-15 — End: 2024-03-23

## 2024-03-23 ENCOUNTER — Telehealth (HOSPITAL_BASED_OUTPATIENT_CLINIC_OR_DEPARTMENT_OTHER): Admitting: Family

## 2024-03-23 ENCOUNTER — Telehealth (HOSPITAL_COMMUNITY): Payer: Self-pay

## 2024-03-23 DIAGNOSIS — F332 Major depressive disorder, recurrent severe without psychotic features: Secondary | ICD-10-CM

## 2024-03-23 MED ORDER — TRAZODONE HCL 50 MG PO TABS: 50.0000 mg | ORAL_TABLET | Freq: Every day | ORAL | 0 refills | Status: DC

## 2024-03-23 MED ORDER — FLUOXETINE HCL 10 MG PO CAPS: 30.0000 mg | ORAL_CAPSULE | Freq: Every day | ORAL | 0 refills | Status: DC

## 2024-03-23 MED ORDER — BUPROPION HCL ER (SR) 100 MG PO TB12: 100.0000 mg | ORAL_TABLET | Freq: Every day | ORAL | 0 refills | Status: DC

## 2024-03-23 NOTE — Progress Notes (Signed)
 Virtual Visit via Telephone Note  I connected with Jonae Renshaw on 03/23/24 at  9:00 AM EDT by telephone and verified that I am speaking with the correct person using two identifiers.  Location: Patient: Home Provider: Home office   I discussed the limitations, risks, security and privacy concerns of performing an evaluation and management service by telephone and the availability of in person appointments. I also discussed with the patient that there may be a patient responsible charge related to this service. The patient expressed understanding and agreed to proceed.   I discussed the assessment and treatment plan with the patient. The patient was provided an opportunity to ask questions and all were answered. The patient agreed with the plan and demonstrated an understanding of the instructions.   The patient was advised to call back or seek an in-person evaluation if the symptoms worsen or if the condition fails to improve as anticipated.  I provided 15 minutes of non-face-to-face time during this encounter.   Oneta Rack, NP   [facility] MD/PA/NP OP Progress Note  03/23/2024 2:28 PM Eleesha Purkey  MRN:  [id number]  Chief Complaint: " I have a sleeping problem"   HPI: Nathania Waldman presents for medication management follow-up appointment.  Evaluated telephonically.  patient has multiple missed follow-up appointments states she is unfamiliar with technology and has not been able to get to appointments.  States more recently she has struggling with smoking.  Is seeking a medication to help with smoking sensation.  Discussed over-the-counter nicotine gum i.e. Chantix and/or patches.  She states that the medication is too expensive.  Denied that she has tried any other medications in the past for smoking cessation.  Reports mood irritability due to inability to sleep.  States she has taken trazodone in the past and would like to be restarted on the medication.  She reports she  has been taking her Prozac as directed.  No reported side effects.  Sleep disturbance: Depression and anxiety:  Discussed initiating Wellbutrin 100 mg daily-continue Prozac 10 mg daily restarted trazodone 50 mg p.o. as needed.    Faige Seely was evaluated telephonically. she is alert/oriented x 3; calm/cooperative; her mood sounded mood congruent with affect.  Patient is speaking in a clear tone at moderate volume, and normal pace.Her thought process is coherent and relevant; multiple concerns throughout this visit however, patient has remained calm throughout assessment and has answered questions appropriately.   Visit Diagnosis:    ICD-10-CM   1. Severe recurrent major depression without psychotic features (HCC)  F33.2 FLUoxetine (PROZAC) 10 MG capsule      Past Psychiatric History:   Past Medical History:  Past Medical History:  Diagnosis Date   Anemia    Depression    Medical history non-contributory    Memory difficulty    Problems with hearing     Past Surgical History:  Procedure Laterality Date   EYE SURGERY     INNER EAR SURGERY  1996    x 4 other 3 as a child   REFRACTIVE SURGERY  07/2014    Family Psychiatric History:   Family History:  Family History  Problem Relation Age of Onset   Diabetes Mellitus II Mother    Prostate cancer Father    Depression Child    Colon cancer Neg Hx     Social History:  Social History   Socioeconomic History   Marital status: Married    Spouse name: Not on file   Number of children:  3   Years of education: Not on file   Highest education level: Not on file  Occupational History   Occupation: vendor    Comment: hot dog  Tobacco Use   Smoking status: Former    Types: Cigarettes    Start date: 09/11/2020   Smokeless tobacco: Never  Vaping Use   Vaping status: Never Used  Substance and Sexual Activity   Alcohol use: Yes    Alcohol/week: 1.0 standard drink of alcohol    Types: 1 Shots of liquor per week     Comment: 2-3 ime per month   Drug use: No   Sexual activity: Yes    Birth control/protection: None  Other Topics Concern   Not on file  Social History Narrative   ** Merged History Encounter **       Social Drivers of Health   Financial Resource Strain: Low Risk  (09/18/2020)   Overall Financial Resource Strain (CARDIA)    Difficulty of Paying Living Expenses: Not hard at all  Food Insecurity: No Food Insecurity (05/20/2023)   Hunger Vital Sign    Worried About Running Out of Food in the Last Year: Never true    Ran Out of Food in the Last Year: Never true  Transportation Needs: No Transportation Needs (05/20/2023)   PRAPARE - Administrator, Civil Service (Medical): No    Lack of Transportation (Non-Medical): No  Physical Activity: Inactive (09/18/2020)   Exercise Vital Sign    Days of Exercise per Week: 0 days    Minutes of Exercise per Session: 0 min  Stress: Stress Concern Present (09/18/2020)   [employer] of Occupational Health - Occupational Stress Questionnaire    Feeling of Stress : To some extent  Social Connections: Unknown (04/28/2022)   Received from [facility]   Social Network    Social Network: Not on file    Allergies: No Known Allergies  Metabolic Disorder Labs: Lab Results  Component Value Date   HGBA1C 5.7 (H) 11/01/2017   MPG 116.89 11/01/2017   No results found for: "PROLACTIN" Lab Results  Component Value Date   CHOL 170 11/01/2017   TRIG 128 11/01/2017   HDL 56 11/01/2017   CHOLHDL 3.0 11/01/2017   VLDL 26 11/01/2017   LDLCALC 88 11/01/2017   Lab Results  Component Value Date   TSH 3.43 10/20/2018   TSH 3.808 11/01/2017    Therapeutic Level Labs: No results found for: "LITHIUM" No results found for: "VALPROATE" No results found for: "CBMZ"  Current Medications: Current Outpatient Medications  Medication Sig Dispense Refill   buPROPion ER (WELLBUTRIN SR) 100 MG 12 hr tablet Take 1 tablet (100 mg  total) by mouth daily. 60 tablet 0   traZODone (DESYREL) 50 MG tablet Take 1 tablet (50 mg total) by mouth at bedtime. 60 tablet 0   albuterol (VENTOLIN HFA) 108 (90 Base) MCG/ACT inhaler Inhale 1 puff into the lungs every 6 (six) hours as needed for wheezing or shortness of breath.     aspirin EC 81 MG tablet Take 81 mg by mouth daily.     FLUoxetine (PROZAC) 10 MG capsule Take 3 capsules (30 mg total) by mouth daily. 90 capsule 0   hydrOXYzine (ATARAX) 25 MG tablet Take 1 tablet (25 mg total) by mouth 3 (three) times daily as needed. 90 tablet 3   Multiple Vitamin (MULTIVITAMIN) tablet Take 1 tablet by mouth daily.     Red Yeast Rice Extract (RED YEAST  RICE PO) Take 1 capsule by mouth daily.     No current facility-administered medications for this visit.     Musculoskeletal: Telephonic assessment  Psychiatric Specialty Exam: Review of Systems  Psychiatric/Behavioral:  Positive for sleep disturbance. The patient is nervous/anxious.   All other systems reviewed and are negative.   unknown if currently breastfeeding.There is no height or weight on file to calculate BMI.  General Appearance: NA  Eye Contact:  NA  Speech:  Clear and Coherent  Volume:  Normal  Mood:  Anxious reported feeling slightly anxious as she continues to smoke stop and start.  States smoking 1 pack of cigarettes per day. "  I need a medication to help me stop permanently."  Affect:  NA  Thought Process:  Coherent  Orientation:  Full (Time, Place, and Person)  Thought Content: Logical   Suicidal Thoughts:  No  Homicidal Thoughts:  No  Memory:  Immediate;   Fair Remote;   Fair  Judgement:  Intact  Insight:  Fair  Psychomotor Activity:  NA  Concentration:  Concentration: Fair  Recall:  [employer] of Knowledge: Fair  Language: Fair  Akathisia:  No-denied  Handed:  Right  AIMS (if indicated): not done  Assets:  Communication Skills Desire for Improvement  ADL's:  Intact  Cognition: WNL  Sleep:  Poor    Screenings: AIMS    Flowsheet Row [facility]  AIMS Total Score 0      AUDIT    Flowsheet Row Ad[facility]  Alcohol Use Disorder Identification Test Final Score (AUDIT) 0      GAD-7    Flowsheet Row Video Visit from 08/08/2022 in [facility] Office Visit from 02/05/2022 in [facility] Clinical Support from 11/07/2021 in [facility] Clinical Support from 08/06/2021 in [facility] Counselor from 07/17/2021 in [facility]  Total GAD-7 Score 9 6 5 3 5       PHQ2-9    Flowsheet Row Office Visit from 03/11/2023 in [facility] Video Visit from 08/08/2022 in [facility] Office Visit from 02/05/2022 in [facility] Clinical Support from 11/07/2021 in [facility] Clinical Support from 08/06/2021 in [facility]  PHQ-2 Total Score 4 0 2 2 1   PHQ-9 Total Score 15 10 12 11 7       Flowsheet Row Clinical Support from 02/13/2021 in [facility] Counselor from 09/18/2020 in [facility]  C-SSRS RISK CATEGORY Error: Q7 should not be populated when Q6 is No No Risk        Assessment and Plan: Eknoor Novack 48 year old female that was evaluated via telephonically.  Patient reports issues related to virtual communication connection.  Currently prescribed Prozac which she reports she has been taking and tolerating well.  Patient is seeking medications to help with smoking sensation.  Discussed utilizing over-the-counter medication however states she is unable to afford prescriptions related to smoking,  since she has tried in the past.  States multiple attempts with stopping and starting tobacco use (cigarettes).  States she has not been sleeping well recently.  Discussed initiating trazodone as she reports she has taken trazodone in the past with symptom relief.  Patient to follow-up 2-3 months in person  Collaboration of Care: Collaboration of Care: Medication Management AEB patient to start Wellbutrin 100mg  daily, continue Prozac 20 mg and trazodone as needed  Patient/Guardian was advised Release of Information must be obtained prior to any record release in order to collaborate their care with an outside provider. Patient/Guardian was advised if they have not already done so to contact the registration department to sign all necessary forms in order for [place] to release information regarding their care.   Consent: Patient/Guardian gives verbal consent for treatment and assignment of benefits for services provided during this visit. Patient/Guardian expressed understanding and agreed to proceed.    Oneta Rack, NP 03/23/2024, 2:28 PM

## 2024-03-24 NOTE — Telephone Encounter (Signed)
error 

## 2024-04-20 ENCOUNTER — Other Ambulatory Visit (HOSPITAL_COMMUNITY): Payer: Self-pay | Admitting: Family

## 2024-04-26 ENCOUNTER — Other Ambulatory Visit (HOSPITAL_COMMUNITY): Payer: Self-pay | Admitting: Family

## 2024-04-26 ENCOUNTER — Other Ambulatory Visit (HOSPITAL_COMMUNITY): Payer: Self-pay

## 2024-04-26 DIAGNOSIS — F332 Major depressive disorder, recurrent severe without psychotic features: Secondary | ICD-10-CM

## 2024-04-26 MED ORDER — BUPROPION HCL ER (SR) 100 MG PO TB12: 100.0000 mg | ORAL_TABLET | Freq: Every day | ORAL | 0 refills | Status: DC

## 2024-04-26 MED ORDER — FLUOXETINE HCL 10 MG PO CAPS: 10.0000 mg | ORAL_CAPSULE | Freq: Every day | ORAL | 0 refills | Status: DC

## 2024-04-26 MED ORDER — TRAZODONE HCL 50 MG PO TABS: 50.0000 mg | ORAL_TABLET | Freq: Every day | ORAL | 0 refills | Status: DC

## 2024-04-26 MED ORDER — FLUOXETINE HCL 10 MG PO CAPS: 30.0000 mg | ORAL_CAPSULE | Freq: Every day | ORAL | 0 refills | Status: DC

## 2024-04-26 MED ORDER — FLUOXETINE HCL 20 MG PO CAPS: 20.0000 mg | ORAL_CAPSULE | Freq: Every day | ORAL | 0 refills | Status: DC

## 2024-06-06 LAB — HEMOGLOBIN A1C: Hemoglobin A1C: 6.1

## 2024-06-06 LAB — AMB RESULTS CONSOLE CBG: Glucose: 125

## 2024-06-06 NOTE — Progress Notes (Signed)
 Patient came to mobile screening at [facility]. Blood pressure within normal rang 129/84. Glucose 125 non fasting. A1C 6.1. Patient declined SDOH.

## 2024-07-26 ENCOUNTER — Other Ambulatory Visit (HOSPITAL_COMMUNITY): Payer: Self-pay

## 2024-07-26 ENCOUNTER — Other Ambulatory Visit (HOSPITAL_COMMUNITY): Payer: Self-pay | Admitting: Family

## 2024-07-26 DIAGNOSIS — F411 Generalized anxiety disorder: Secondary | ICD-10-CM

## 2024-07-26 MED ORDER — BUPROPION HCL ER (SR) 100 MG PO TB12: 100.0000 mg | ORAL_TABLET | Freq: Every day | ORAL | 0 refills | Status: DC

## 2024-07-26 MED ORDER — FLUOXETINE HCL 10 MG PO CAPS: 10.0000 mg | ORAL_CAPSULE | Freq: Every day | ORAL | 0 refills | Status: DC

## 2024-07-26 MED ORDER — TRAZODONE HCL 50 MG PO TABS: 50.0000 mg | ORAL_TABLET | Freq: Every day | ORAL | 0 refills | Status: DC

## 2024-07-26 MED ORDER — FLUOXETINE HCL 20 MG PO CAPS: 20.0000 mg | ORAL_CAPSULE | Freq: Every day | ORAL | 0 refills | Status: DC

## 2024-08-16 ENCOUNTER — Ambulatory Visit (HOSPITAL_BASED_OUTPATIENT_CLINIC_OR_DEPARTMENT_OTHER): Admitting: Family

## 2024-08-16 VITALS — BP 128/77 | HR 95 | Ht 62.0 in | Wt 161.0 lb

## 2024-08-16 DIAGNOSIS — F331 Major depressive disorder, recurrent, moderate: Secondary | ICD-10-CM | POA: Diagnosis not present

## 2024-08-16 MED ORDER — FLUOXETINE HCL 40 MG PO CAPS: 40.0000 mg | ORAL_CAPSULE | Freq: Every day | ORAL | 0 refills | Status: DC

## 2024-08-16 MED ORDER — TRAZODONE HCL 50 MG PO TABS: 50.0000 mg | ORAL_TABLET | Freq: Every day | ORAL | 0 refills | Status: DC

## 2024-08-16 NOTE — Progress Notes (Signed)
 [facility] MD/PA/NP OP Progress Note  08/16/2024 1:05 PM Angela Romero  MRN:  [id number]  Chief Complaint: No chief complaint on file.  HPI: Angela Romero 48 year old female presents for medication management follow-up appointment.  Seen and evaluated face-to-face by this provider.  Anayansi carries a diagnosis related to major depressive disorder, generalized anxiety disorder and mixed disturbance of emotional conduct.  She is currently prescribed Wellbutrin  100 mg daily, Prozac  30 mg, trazodone  50 mg p.o. nightly as needed and hydroxyzine  25 mg p.o. 3 times daily as needed.  She reports she has not been taking Wellbutrin  as directed.  States she has since discontinued however appears to have some confusion related to pills.  States  is that the blue one.    Eather reports she has been taking Prozac  as directed and is requesting medication increase due to different cost between the 10 mg and 20 mg tablet.  States she does have lingering depression symptoms however did not elaborate on any stressors.  She denied suicidal or homicidal ideations.  Denies auditory visual hallucinations.  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  States children are at home no concerns related to family strain.  Reports a fair appetite.  States resting okay throughout the night does report having a hard time falling asleep.  States she utilizes trazodone  sometimes.  Does report occasional alcohol use.  Denies that she is currently followed by therapy services.  Patient encouraged to bring medications at follow-up appointment.  And consideration for therapy services.  Support encouragement reassurance was provided.  Visit Diagnosis:    ICD-10-CM   1. Moderate episode of recurrent major depressive disorder (HCC)  F33.1       Past Psychiatric History:   Past Medical History:  Past Medical History:  Diagnosis Date   Anemia    Depression    Medical history non-contributory    Memory difficulty    Problems  with hearing     Past Surgical History:  Procedure Laterality Date   EYE SURGERY     INNER EAR SURGERY  1996    x 4 other 3 as a child   REFRACTIVE SURGERY  07/2014    Family Psychiatric History:   Family History:  Family History  Problem Relation Age of Onset   Diabetes Mellitus II Mother    Prostate cancer Father    Depression Child    Colon cancer Neg Hx     Social History:  Social History   Socioeconomic History   Marital status: Married    Spouse name: Not on file   Number of children: 3   Years of education: Not on file   Highest education level: Not on file  Occupational History   Occupation: vendor    Comment: hot dog  Tobacco Use   Smoking status: Former    Types: Cigarettes    Start date: 09/11/2020   Smokeless tobacco: Never  Vaping Use   Vaping status: Never Used  Substance and Sexual Activity   Alcohol use: Yes    Alcohol/week: 1.0 standard drink of alcohol    Types: 1 Shots of liquor per week    Comment: 2-3 ime per month   Drug use: No   Sexual activity: Yes    Birth control/protection: None  Other Topics Concern   Not on file  Social History Narrative   ** Merged History Encounter **       Social Drivers of Corporate investment banker Strain: Low Risk  (  09/18/2020)   Overall Financial Resource Strain (CARDIA)    Difficulty of Paying Living Expenses: Not hard at all  Food Insecurity: Patient Declined (06/06/2024)   Hunger Vital Sign    Worried About Running Out of Food in the Last Year: Patient declined    Ran Out of Food in the Last Year: Patient declined  Transportation Needs: Patient Declined (06/06/2024)   PRAPARE - Administrator, Civil Service (Medical): Patient declined    Lack of Transportation (Non-Medical): Patient declined  Physical Activity: Inactive (09/18/2020)   Exercise Vital Sign    Days of Exercise per Week: 0 days    Minutes of Exercise per Session: 0 min  Stress: Stress Concern Present (09/18/2020)    [employer] of Occupational Health - Occupational Stress Questionnaire    Feeling of Stress : To some extent  Social Connections: Unknown (04/28/2022)   [facility]   Social Network    Social Network: Not on file    Allergies: No Known Allergies  Metabolic Disorder Labs: Lab Results  Component Value Date   HGBA1C 6.1 06/06/2024   MPG 116.89 11/01/2017   No results found for: PROLACTIN Lab Results  Component Value Date   CHOL 170 11/01/2017   TRIG 128 11/01/2017   HDL 56 11/01/2017   CHOLHDL 3.0 11/01/2017   VLDL 26 11/01/2017   LDLCALC 88 11/01/2017   Lab Results  Component Value Date   TSH 3.43 10/20/2018   TSH 3.808 11/01/2017    Therapeutic Level Labs: No results found for: LITHIUM No results found for: VALPROATE No results found for: CBMZ  Current Medications: Current Outpatient Medications  Medication Sig Dispense Refill   albuterol  (VENTOLIN  HFA) 108 (90 Base) MCG/ACT inhaler Inhale 1 puff into the lungs every 6 (six) hours as needed for wheezing or shortness of breath.     aspirin EC 81 MG tablet Take 81 mg by mouth daily.     buPROPion  ER (WELLBUTRIN  SR) 100 MG 12 hr tablet Take 1 tablet (100 mg total) by mouth daily. 30 tablet 0   FLUoxetine  (PROZAC ) 10 MG capsule Take 1 capsule (10 mg total) by mouth daily. 30 capsule 0   FLUoxetine  (PROZAC ) 20 MG capsule Take 1 capsule (20 mg total) by mouth daily. 30 capsule 0   hydrOXYzine  (ATARAX ) 25 MG tablet Take 1 tablet (25 mg total) by mouth 3 (three) times daily as needed. 90 tablet 3   Multiple Vitamin (MULTIVITAMIN) tablet Take 1 tablet by mouth daily.     Red Yeast Rice Extract (RED YEAST RICE PO) Take 1 capsule by mouth daily.     traZODone  (DESYREL ) 50 MG tablet Take 1 tablet (50 mg total) by mouth at bedtime. 30 tablet 0   No current facility-administered medications for this visit.     Musculoskeletal: Strength & Muscle Tone: within normal limits Gait & Station:  normal Patient leans: N/A  Psychiatric Specialty Exam: Review of Systems  unknown if currently breastfeeding.There is no height or weight on file to calculate BMI.  General Appearance: Casual  Eye Contact:  Good  Speech:  Clear and Coherent  Volume:  Normal  Mood:  Anxious and Depressed  Affect:  Congruent  Thought Process:  Coherent  Orientation:  Full (Time, Place, and Person)  Thought Content: Logical   Suicidal Thoughts:  No  Homicidal Thoughts:  No  Memory:  Immediate;   Good Recent;   Good  Judgement:  Good  Insight:  Good  Psychomotor  Activity:  Normal  Concentration:  Concentration: Good  Recall:  Good  Fund of Knowledge: Good  Language: Good  Akathisia:  No  Handed:  Right  AIMS (if indicated): not done  Assets:  Communication Skills Desire for Improvement  ADL's:  Intact  Cognition: WNL  Sleep:  Fair   Screenings: AIMS    Flowsheet Row [facility]  AIMS Total Score 0   AUDIT    Flowsheet Row [facility]  Alcohol Use Disorder Identification Test Final Score (AUDIT) 0   GAD-7    Flowsheet Row Video Visit from 08/08/2022 in [facility] Office Visit from 02/05/2022 in [facility] Clinical Support from 11/07/2021 in [facility] Clinical Support from 08/06/2021 in [facility] Counselor from 07/17/2021 in [facility]  Total GAD-7 Score 9 6 5 3 5    PHQ2-9    Flowsheet Row Office Visit from 03/11/2023 in [facility] Video Visit from 08/08/2022 in [facility] Office Visit from 02/05/2022 in [facility] Clinical Support from 11/07/2021 in [facility]  Clinical Support from 08/06/2021 in [facility]  PHQ-2 Total Score 4 0 2 2 1   PHQ-9 Total Score 15 10 12 11 7    Flowsheet Row Clinical Support from 02/13/2021 in [facility] Counselor from 09/18/2020 in [facility]  C-SSRS RISK CATEGORY Error: Q7 should not be populated when Q6 is No No Risk     Assessment and Plan: Angela Romero 48 year old female presents for medication management follow-up appointment.  Reports increased depression symptoms however did not elaborate with any recent stressors reports a fair appetite.  States she is resting well throughout the night.  States she is currently employed at a [employer].  Denied that she is currently followed by therapy services.  States she has been prescribed multiple antidepressants in the past but is unable to recall the name at this time.  Discontinue Wellbutrin  100 mg increased Prozac  30 mg to Prozac  40 mg, continue trazodone  and hydroxyzine  as needed.  Follow-up 3 months for medication adherence/tolerability  Collaboration of Care: Collaboration of Care: Medication Management AEB discontinue Wellbutrin  -Encouraged to bring medications at follow-up appointment -Consideration for starting therapy services   Patient/Guardian was advised Release of Information must be obtained prior to any record release in order to collaborate their care with an outside provider. Patient/Guardian was advised if they have not already done so to contact the registration department to sign all necessary forms in order for us  to release information regarding their care.   Consent: Patient/Guardian gives verbal consent for treatment and assignment of benefits for services provided during this visit. Patient/Guardian expressed understanding and agreed to proceed.    Staci LOISE Kerns, NP 08/16/2024, 1:05 PM

## 2024-10-05 NOTE — Progress Notes (Signed)
 The patient attended a screening event on 06/06/24 where her screening results were 129/84 bp and glucose was 125. Her A1C was 6.1. Pt noted that she had a pcp and didn't smoke at the event. PT also noted that she did not have any sdoh insecurities. Per chart review pt does have a pcp and the last office visit was on 07/10/23.  However the pt is consistent with care and has been seeing specialists who she received referrals for. According to the pts chart she was seen for Type 2 diabetes most recently on 02/04/24. Pt has metformin listed on her current medication list. Chart review does not indicate any future appts. [facility] called the pcp listed in the chart and on the consent form and they stated that she has transferred to [facility]. The pt last office visit with that provider was 06/03/24. The pt chart has been updated to show new pcp. An abnormal results letter was sent along with education resources as a curtesy.  No additional Health equity team support indicated at this time.

## 2024-10-07 NOTE — Progress Notes (Incomplete)
 The patient attended a screening event on 06/06/24 where her screening results were 129/84 bp and glucose was 125. Her A1C was 6.1. Pt noted that she had a pcp and didn't smoke at the event. PT also noted that she did not have any sdoh insecurities. Per chart review pt does have a pcp and the last office visit was on . Chart review also indicates(future appt). No additional Health equity team support indicated at this time./An additional follow up will be done in according to the health equity team's protocol.     No sdoh, got pcp, has insurance [phone] A1c 6.1 bp 129/84, sugar 125 wnl, recommended to avoid high sugars/carbs reg checks, seen fo diabetes on 06/03/24 after event, pcp aware,no sdoh,Kristen Loa Aho, PA

## 2024-10-27 ENCOUNTER — Other Ambulatory Visit (HOSPITAL_COMMUNITY): Payer: Self-pay | Admitting: Family

## 2024-10-27 ENCOUNTER — Other Ambulatory Visit (HOSPITAL_COMMUNITY): Payer: Self-pay

## 2024-10-27 MED ORDER — FLUOXETINE HCL 40 MG PO CAPS: 40.0000 mg | ORAL_CAPSULE | Freq: Every day | ORAL | 0 refills | Status: DC

## 2024-11-07 ENCOUNTER — Ambulatory Visit (HOSPITAL_COMMUNITY): Payer: Self-pay | Admitting: Family

## 2024-11-07 ENCOUNTER — Other Ambulatory Visit (HOSPITAL_COMMUNITY): Payer: Self-pay

## 2024-11-07 MED ORDER — FLUOXETINE HCL 40 MG PO CAPS: 40.0000 mg | ORAL_CAPSULE | Freq: Every day | ORAL | 1 refills | Status: AC

## 2024-11-07 MED ORDER — TRAZODONE HCL 50 MG PO TABS: 50.0000 mg | ORAL_TABLET | Freq: Every day | ORAL | 1 refills | Status: AC

## 2024-12-27 ENCOUNTER — Ambulatory Visit (HOSPITAL_COMMUNITY): Payer: Self-pay | Admitting: Family

## 2024-12-27 ENCOUNTER — Encounter (HOSPITAL_COMMUNITY): Payer: Self-pay
